# Patient Record
Sex: Male | Born: 1960 | Race: White | Hispanic: No | Marital: Married | Smoking: Former smoker
Health system: Southern US, Community
[De-identification: ages and names within clinical notes are randomized; demographics above are authoritative.]

## PROBLEM LIST (undated history)

## (undated) DIAGNOSIS — K7460 Unspecified cirrhosis of liver: Secondary | ICD-10-CM

## (undated) DIAGNOSIS — F329 Major depressive disorder, single episode, unspecified: Secondary | ICD-10-CM

## (undated) DIAGNOSIS — N189 Chronic kidney disease, unspecified: Secondary | ICD-10-CM

## (undated) DIAGNOSIS — B1920 Unspecified viral hepatitis C without hepatic coma: Secondary | ICD-10-CM

## (undated) DIAGNOSIS — K429 Umbilical hernia without obstruction or gangrene: Secondary | ICD-10-CM

## (undated) DIAGNOSIS — K649 Unspecified hemorrhoids: Secondary | ICD-10-CM

## (undated) DIAGNOSIS — M1990 Unspecified osteoarthritis, unspecified site: Secondary | ICD-10-CM

## (undated) DIAGNOSIS — M5030 Other cervical disc degeneration, unspecified cervical region: Secondary | ICD-10-CM

## (undated) DIAGNOSIS — R51 Headache: Secondary | ICD-10-CM

## (undated) DIAGNOSIS — G709 Myoneural disorder, unspecified: Secondary | ICD-10-CM

## (undated) DIAGNOSIS — F32A Depression, unspecified: Secondary | ICD-10-CM

---

## 1987-01-07 HISTORY — PX: FACIAL RECONSTRUCTION SURGERY: SHX631

## 1987-01-07 HISTORY — PX: EYE SURGERY: SHX253

## 1998-01-06 DIAGNOSIS — B1920 Unspecified viral hepatitis C without hepatic coma: Secondary | ICD-10-CM

## 1998-01-06 HISTORY — DX: Unspecified viral hepatitis C without hepatic coma: B19.20

## 1998-06-19 ENCOUNTER — Emergency Department (HOSPITAL_COMMUNITY): Admission: EM | Admit: 1998-06-19 | Discharge: 1998-06-19 | Payer: Self-pay | Admitting: Emergency Medicine

## 1998-06-20 ENCOUNTER — Encounter: Payer: Self-pay | Admitting: Emergency Medicine

## 1998-06-20 ENCOUNTER — Ambulatory Visit (HOSPITAL_COMMUNITY): Admission: RE | Admit: 1998-06-20 | Discharge: 1998-06-20 | Payer: Self-pay | Admitting: Emergency Medicine

## 1998-10-29 ENCOUNTER — Encounter: Payer: Self-pay | Admitting: Gastroenterology

## 1998-10-29 ENCOUNTER — Ambulatory Visit (HOSPITAL_COMMUNITY): Admission: RE | Admit: 1998-10-29 | Discharge: 1998-10-29 | Payer: Self-pay | Admitting: Gastroenterology

## 1998-11-01 ENCOUNTER — Encounter: Payer: Self-pay | Admitting: Gastroenterology

## 1998-11-01 ENCOUNTER — Ambulatory Visit (HOSPITAL_COMMUNITY): Admission: RE | Admit: 1998-11-01 | Discharge: 1998-11-01 | Payer: Self-pay | Admitting: Gastroenterology

## 1999-03-15 ENCOUNTER — Encounter: Payer: Self-pay | Admitting: Gastroenterology

## 1999-03-15 ENCOUNTER — Ambulatory Visit (HOSPITAL_COMMUNITY): Admission: RE | Admit: 1999-03-15 | Discharge: 1999-03-15 | Payer: Self-pay | Admitting: Gastroenterology

## 2001-01-06 HISTORY — PX: CERVICAL SPINE SURGERY: SHX589

## 2001-02-25 ENCOUNTER — Inpatient Hospital Stay (HOSPITAL_COMMUNITY): Admission: RE | Admit: 2001-02-25 | Discharge: 2001-02-27 | Payer: Self-pay | Admitting: Neurosurgery

## 2001-02-25 ENCOUNTER — Encounter: Payer: Self-pay | Admitting: Neurosurgery

## 2004-02-04 ENCOUNTER — Emergency Department: Payer: Self-pay | Admitting: Emergency Medicine

## 2004-02-05 ENCOUNTER — Ambulatory Visit: Payer: Self-pay | Admitting: Emergency Medicine

## 2004-02-07 ENCOUNTER — Ambulatory Visit: Payer: Self-pay

## 2004-02-12 ENCOUNTER — Encounter: Admission: RE | Admit: 2004-02-12 | Discharge: 2004-02-12 | Payer: Self-pay | Admitting: Gastroenterology

## 2007-08-16 ENCOUNTER — Emergency Department: Payer: Self-pay | Admitting: Emergency Medicine

## 2010-05-15 ENCOUNTER — Emergency Department: Payer: Self-pay | Admitting: Emergency Medicine

## 2010-05-31 ENCOUNTER — Other Ambulatory Visit: Payer: Self-pay | Admitting: Orthopaedic Surgery

## 2010-05-31 DIAGNOSIS — M542 Cervicalgia: Secondary | ICD-10-CM

## 2010-06-05 ENCOUNTER — Ambulatory Visit
Admission: RE | Admit: 2010-06-05 | Discharge: 2010-06-05 | Disposition: A | Discharge status: Home / Self Care | Payer: PRIVATE HEALTH INSURANCE | Source: Ambulatory Visit | Attending: Orthopaedic Surgery | Admitting: Orthopaedic Surgery

## 2010-06-05 DIAGNOSIS — M542 Cervicalgia: Secondary | ICD-10-CM

## 2010-06-06 ENCOUNTER — Other Ambulatory Visit: Payer: Self-pay | Admitting: Orthopaedic Surgery

## 2010-06-06 ENCOUNTER — Ambulatory Visit
Admission: RE | Admit: 2010-06-06 | Discharge: 2010-06-06 | Disposition: A | Discharge status: Home / Self Care | Payer: PRIVATE HEALTH INSURANCE | Source: Ambulatory Visit | Attending: Orthopaedic Surgery | Admitting: Orthopaedic Surgery

## 2010-06-06 DIAGNOSIS — S129XXA Fracture of neck, unspecified, initial encounter: Secondary | ICD-10-CM

## 2010-06-07 ENCOUNTER — Other Ambulatory Visit: Payer: PRIVATE HEALTH INSURANCE

## 2010-09-13 ENCOUNTER — Other Ambulatory Visit: Payer: Self-pay | Admitting: Neurosurgery

## 2010-09-13 DIAGNOSIS — M549 Dorsalgia, unspecified: Secondary | ICD-10-CM

## 2010-09-13 DIAGNOSIS — M542 Cervicalgia: Secondary | ICD-10-CM

## 2010-09-13 DIAGNOSIS — M5410 Radiculopathy, site unspecified: Secondary | ICD-10-CM

## 2010-09-16 MED ORDER — DIAZEPAM 2 MG PO TABS
10.0000 mg | ORAL_TABLET | Freq: Once | ORAL | Status: AC
  Administered 2010-09-17: 10 mg via ORAL

## 2010-09-17 ENCOUNTER — Ambulatory Visit
Admission: RE | Admit: 2010-09-17 | Discharge: 2010-09-17 | Disposition: A | Discharge status: Home / Self Care | Payer: Medicaid Other | Source: Ambulatory Visit | Attending: Neurosurgery | Admitting: Neurosurgery

## 2010-09-17 DIAGNOSIS — M5410 Radiculopathy, site unspecified: Secondary | ICD-10-CM

## 2010-09-17 DIAGNOSIS — M549 Dorsalgia, unspecified: Secondary | ICD-10-CM

## 2010-09-17 DIAGNOSIS — M542 Cervicalgia: Secondary | ICD-10-CM

## 2010-09-17 HISTORY — DX: Unspecified viral hepatitis C without hepatic coma: B19.20

## 2010-09-17 MED ORDER — IOHEXOL 300 MG/ML  SOLN
10.0000 mL | Freq: Once | INTRAMUSCULAR | Status: AC | PRN
  Administered 2010-09-17: 10 mL via INTRATHECAL

## 2011-07-08 ENCOUNTER — Other Ambulatory Visit: Payer: Self-pay | Admitting: Neurosurgery

## 2011-07-08 DIAGNOSIS — M542 Cervicalgia: Secondary | ICD-10-CM

## 2011-07-08 DIAGNOSIS — M5410 Radiculopathy, site unspecified: Secondary | ICD-10-CM

## 2011-07-08 DIAGNOSIS — M549 Dorsalgia, unspecified: Secondary | ICD-10-CM

## 2011-07-09 ENCOUNTER — Ambulatory Visit
Admission: RE | Admit: 2011-07-09 | Discharge: 2011-07-09 | Disposition: A | Discharge status: Home / Self Care | Payer: Medicaid Other | Source: Ambulatory Visit | Attending: Neurosurgery | Admitting: Neurosurgery

## 2011-07-09 VITALS — BP 86/59 | HR 63

## 2011-07-09 DIAGNOSIS — M5410 Radiculopathy, site unspecified: Secondary | ICD-10-CM

## 2011-07-09 DIAGNOSIS — M549 Dorsalgia, unspecified: Secondary | ICD-10-CM

## 2011-07-09 DIAGNOSIS — M542 Cervicalgia: Secondary | ICD-10-CM

## 2011-07-09 MED ORDER — MEPERIDINE HCL 100 MG/ML IJ SOLN
75.0000 mg | Freq: Once | INTRAMUSCULAR | Status: AC
  Administered 2011-07-09: 75 mg via INTRAMUSCULAR

## 2011-07-09 MED ORDER — DIAZEPAM 5 MG PO TABS
10.0000 mg | ORAL_TABLET | Freq: Once | ORAL | Status: AC
  Administered 2011-07-09: 10 mg via ORAL

## 2011-07-09 MED ORDER — IOHEXOL 300 MG/ML  SOLN
10.0000 mL | Freq: Once | INTRAMUSCULAR | Status: AC | PRN
  Administered 2011-07-09: 10 mL via INTRATHECAL

## 2011-07-09 MED ORDER — ONDANSETRON HCL 4 MG/2ML IJ SOLN
4.0000 mg | Freq: Once | INTRAMUSCULAR | Status: AC
  Administered 2011-07-09: 4 mg via INTRAMUSCULAR

## 2011-07-29 ENCOUNTER — Other Ambulatory Visit: Payer: Self-pay | Admitting: Neurosurgery

## 2011-07-31 ENCOUNTER — Encounter (HOSPITAL_COMMUNITY): Payer: Self-pay | Admitting: Certified Registered Nurse Anesthetist

## 2011-07-31 ENCOUNTER — Inpatient Hospital Stay (HOSPITAL_COMMUNITY): Payer: Medicaid Other | Admitting: Certified Registered Nurse Anesthetist

## 2011-07-31 ENCOUNTER — Encounter (HOSPITAL_COMMUNITY): Payer: Self-pay | Admitting: *Deleted

## 2011-07-31 MED ORDER — DEXTROSE 5 % IV SOLN: 2.0000 g | INTRAVENOUS | Status: DC

## 2011-07-31 NOTE — Progress Notes (Signed)
PCP is Dr Glenis Smoker at [facility].

## 2011-08-01 ENCOUNTER — Encounter (HOSPITAL_COMMUNITY): Payer: Self-pay | Admitting: Certified Registered Nurse Anesthetist

## 2011-08-01 ENCOUNTER — Other Ambulatory Visit: Payer: Self-pay

## 2011-08-01 ENCOUNTER — Encounter (HOSPITAL_COMMUNITY)
Admission: RE | Disposition: A | Discharge status: Home / Self Care | Payer: Self-pay | Source: Ambulatory Visit | Attending: Neurosurgery

## 2011-08-01 ENCOUNTER — Ambulatory Visit (HOSPITAL_COMMUNITY)
Admission: RE | Admit: 2011-08-01 | Discharge: 2011-08-01 | Disposition: A | Discharge status: Home / Self Care | Payer: Medicaid Other | Source: Ambulatory Visit | Attending: Neurosurgery | Admitting: Neurosurgery

## 2011-08-01 ENCOUNTER — Encounter (HOSPITAL_COMMUNITY): Payer: Self-pay | Admitting: *Deleted

## 2011-08-01 DIAGNOSIS — Z01812 Encounter for preprocedural laboratory examination: Secondary | ICD-10-CM | POA: Insufficient documentation

## 2011-08-01 DIAGNOSIS — Z01818 Encounter for other preprocedural examination: Secondary | ICD-10-CM | POA: Insufficient documentation

## 2011-08-01 DIAGNOSIS — M4802 Spinal stenosis, cervical region: Secondary | ICD-10-CM | POA: Insufficient documentation

## 2011-08-01 DIAGNOSIS — E119 Type 2 diabetes mellitus without complications: Secondary | ICD-10-CM | POA: Insufficient documentation

## 2011-08-01 HISTORY — DX: Headache: R51

## 2011-08-01 HISTORY — DX: Unspecified hemorrhoids: K64.9

## 2011-08-01 HISTORY — DX: Depression, unspecified: F32.A

## 2011-08-01 HISTORY — DX: Major depressive disorder, single episode, unspecified: F32.9

## 2011-08-01 HISTORY — DX: Myoneural disorder, unspecified: G70.9

## 2011-08-01 HISTORY — DX: Unspecified osteoarthritis, unspecified site: M19.90

## 2011-08-01 HISTORY — DX: Chronic kidney disease, unspecified: N18.9

## 2011-08-01 LAB — COMPREHENSIVE METABOLIC PANEL
ALT: 45 U/L (ref 0–53)
AST: 42 U/L — ABNORMAL HIGH (ref 0–37)
Alkaline Phosphatase: 104 U/L (ref 39–117)
CO2: 26 mEq/L (ref 19–32)
Chloride: 106 mEq/L (ref 96–112)
GFR calc non Af Amer: >90 mL/min (ref >90)
Potassium: 3.6 mEq/L (ref 3.5–5.1)
Sodium: 140 mEq/L (ref 135–145)
Total Bilirubin: 0.7 mg/dL (ref 0.3–1.2)

## 2011-08-01 LAB — CBC
MCH: 35.9 pg — ABNORMAL HIGH (ref 26.0–34.0)
MCHC: 34.5 g/dL (ref 30.0–36.0)
Platelets: 61 10*3/uL — ABNORMAL LOW (ref 150–400)

## 2011-08-01 LAB — SURGICAL PCR SCREEN: MRSA, PCR: NEGATIVE

## 2011-08-01 SURGERY — ANTERIOR CERVICAL DECOMPRESSION/DISCECTOMY FUSION 3 LEVELS
Anesthesia: General

## 2011-08-01 MED ORDER — CEFAZOLIN SODIUM-DEXTROSE 2-3 GM-% IV SOLR
INTRAVENOUS | Status: AC
  Filled 2011-08-01: qty 50

## 2011-08-01 MED ORDER — MUPIROCIN 2 % EX OINT
TOPICAL_OINTMENT | CUTANEOUS | Status: AC
  Administered 2011-08-01: 1
  Filled 2011-08-01: qty 22

## 2011-08-01 SURGICAL SUPPLY — 62 items
APL SKNCLS STERI-STRIP NONHPOA (GAUZE/BANDAGES/DRESSINGS)
BANDAGE GAUZE ELAST BULKY 4 IN (GAUZE/BANDAGES/DRESSINGS) ×2 IMPLANT
BENZOIN TINCTURE PRP APPL 2/3 (GAUZE/BANDAGES/DRESSINGS) ×1 IMPLANT
BLADE ULTRA TIP 2M (BLADE) ×1 IMPLANT
BUR BARREL STRAIGHT FLUTE 4.0 (BURR) IMPLANT
BUR MATCHSTICK NEURO 3.0 LAGG (BURR) ×1 IMPLANT
CANISTER SUCTION 2500CC (MISCELLANEOUS) ×1 IMPLANT
CLOTH BEACON ORANGE TIMEOUT ST (SAFETY) ×1 IMPLANT
CONT SPEC 4OZ CLIKSEAL STRL BL (MISCELLANEOUS) ×1 IMPLANT
COVER MAYO STAND STRL (DRAPES) ×1 IMPLANT
DRAPE LAPAROTOMY 100X72 PEDS (DRAPES) ×1 IMPLANT
DRAPE MICROSCOPE LEICA (MISCELLANEOUS) ×1 IMPLANT
DRAPE POUCH INSTRU U-SHP 10X18 (DRAPES) ×1 IMPLANT
DRAPE PROXIMA HALF (DRAPES) IMPLANT
DURAPREP 6ML APPLICATOR 50/CS (WOUND CARE) ×1 IMPLANT
ELECT REM PT RETURN 9FT ADLT (ELECTROSURGICAL)
ELECTRODE REM PT RTRN 9FT ADLT (ELECTROSURGICAL) ×1 IMPLANT
GAUZE SPONGE 4X4 16PLY XRAY LF (GAUZE/BANDAGES/DRESSINGS) IMPLANT
GLOVE BIO SURGEON STRL SZ 6.5 (GLOVE) IMPLANT
GLOVE BIO SURGEON STRL SZ7 (GLOVE) IMPLANT
GLOVE BIO SURGEON STRL SZ7.5 (GLOVE) IMPLANT
GLOVE BIO SURGEON STRL SZ8 (GLOVE) IMPLANT
GLOVE BIO SURGEON STRL SZ8.5 (GLOVE) IMPLANT
GLOVE BIOGEL M 8.0 STRL (GLOVE) ×1 IMPLANT
GLOVE BIOGEL PI IND STRL 7.5 (GLOVE) IMPLANT
GLOVE BIOGEL PI INDICATOR 7.5 (GLOVE)
GLOVE ECLIPSE 6.5 STRL STRAW (GLOVE) IMPLANT
GLOVE ECLIPSE 7.0 STRL STRAW (GLOVE) IMPLANT
GLOVE ECLIPSE 7.5 STRL STRAW (GLOVE) IMPLANT
GLOVE ECLIPSE 8.0 STRL XLNG CF (GLOVE) IMPLANT
GLOVE ECLIPSE 8.5 STRL (GLOVE) IMPLANT
GLOVE EXAM NITRILE LRG STRL (GLOVE) IMPLANT
GLOVE EXAM NITRILE MD LF STRL (GLOVE) IMPLANT
GLOVE EXAM NITRILE XL STR (GLOVE) IMPLANT
GLOVE EXAM NITRILE XS STR PU (GLOVE) IMPLANT
GLOVE INDICATOR 6.5 STRL GRN (GLOVE) IMPLANT
GLOVE INDICATOR 7.0 STRL GRN (GLOVE) IMPLANT
GLOVE INDICATOR 7.5 STRL GRN (GLOVE) IMPLANT
GLOVE INDICATOR 8.0 STRL GRN (GLOVE) IMPLANT
GLOVE INDICATOR 8.5 STRL (GLOVE) IMPLANT
GLOVE OPTIFIT SS 8.0 STRL (GLOVE) IMPLANT
GLOVE SURG SS PI 6.5 STRL IVOR (GLOVE) IMPLANT
GOWN BRE IMP SLV AUR LG STRL (GOWN DISPOSABLE) ×1 IMPLANT
GOWN BRE IMP SLV AUR XL STRL (GOWN DISPOSABLE) IMPLANT
GOWN STRL REIN 2XL LVL4 (GOWN DISPOSABLE) IMPLANT
HEAD HALTER (SOFTGOODS) ×1 IMPLANT
HEMOSTAT POWDER KIT SURGIFOAM (HEMOSTASIS) IMPLANT
KIT BASIN OR (CUSTOM PROCEDURE TRAY) ×1 IMPLANT
KIT ROOM TURNOVER OR (KITS) ×1 IMPLANT
NS IRRIG 1000ML POUR BTL (IV SOLUTION) ×1 IMPLANT
PACK LAMINECTOMY NEURO (CUSTOM PROCEDURE TRAY) ×1 IMPLANT
PATTIES SURGICAL .5 X1 (DISPOSABLE) ×1 IMPLANT
RUBBERBAND STERILE (MISCELLANEOUS) ×2 IMPLANT
SPONGE GAUZE 4X4 12PLY (GAUZE/BANDAGES/DRESSINGS) ×1 IMPLANT
SPONGE INTESTINAL PEANUT (DISPOSABLE) ×2 IMPLANT
SPONGE SURGIFOAM ABS GEL 100 (HEMOSTASIS) ×1 IMPLANT
STRIP CLOSURE SKIN 1/2X4 (GAUZE/BANDAGES/DRESSINGS) ×1 IMPLANT
SUT VIC AB 3-0 SH 8-18 (SUTURE) ×1 IMPLANT
SYR 20ML ECCENTRIC (SYRINGE) ×1 IMPLANT
TOWEL OR 17X24 6PK STRL BLUE (TOWEL DISPOSABLE) ×1 IMPLANT
TOWEL OR 17X26 10 PK STRL BLUE (TOWEL DISPOSABLE) ×1 IMPLANT
WATER STERILE IRR 1000ML POUR (IV SOLUTION) ×1 IMPLANT

## 2011-08-01 NOTE — Anesthesia Preprocedure Evaluation (Signed)
Anesthesia Evaluation  Patient identified by MRN, date of birth, ID band Patient awake    Reviewed: Allergy & Precautions, H&P , NPO status , Patient's Chart, lab work & pertinent test results  History of Anesthesia Complications (+) PONV  Airway Mallampati: II TM Distance: <3 FB Neck ROM: Limited  Mouth opening: Limited Mouth Opening  Dental  (+) Chipped   Pulmonary former smoker,    Pulmonary exam normal       Cardiovascular     Neuro/Psych  Headaches, PSYCHIATRIC DISORDERS Depression    GI/Hepatic (+) Hepatitis -, C  Endo/Other  Type 2  Renal/GU      Musculoskeletal   Abdominal Normal abdominal exam  (+)   Peds  Hematology   Anesthesia Other Findings   Reproductive/Obstetrics                           Anesthesia Physical Anesthesia Plan  ASA: III  Anesthesia Plan: General   Post-op Pain Management:    Induction: Intravenous  Airway Management Planned: Oral ETT  Additional Equipment:   Intra-op Plan:   Post-operative Plan: Extubation in OR  Informed Consent: I have reviewed the patients History and Physical, chart, labs and discussed the procedure including the risks, benefits and alternatives for the proposed anesthesia with the patient or authorized representative who has indicated his/her understanding and acceptance.   Dental advisory given  Plan Discussed with: CRNA, Anesthesiologist and Surgeon  Anesthesia Plan Comments:         Anesthesia Quick Evaluation

## 2011-08-01 NOTE — Transfer of Care (Signed)
Case cancelled

## 2011-08-01 NOTE — Anesthesia Postprocedure Evaluation (Signed)
Case cancelled

## 2011-08-01 NOTE — Preoperative (Signed)
Beta Blockers   Reason not to administer Beta Blockers:Not Applicable 

## 2011-08-01 NOTE — Progress Notes (Signed)
Patient ID: Benjamin Bradley, male   DOB: 1960-12-23, 51 y.o.   MRN: [id number] patien to have cervical fusion today. His platelets count was 60k   mutual   with anesthesia the surgery was cancelled. He is to be seen by his MD in [facility]

## 2011-08-01 NOTE — Progress Notes (Signed)
NOTIFIED DR Jean Rosenthal OF EKG RESULTS, SHE WILL EVALUATE PATIENT IN OR.

## 2014-01-19 ENCOUNTER — Encounter (HOSPITAL_COMMUNITY): Payer: Self-pay | Admitting: Neurosurgery

## 2015-08-11 ENCOUNTER — Encounter (HOSPITAL_COMMUNITY): Payer: Self-pay | Admitting: Emergency Medicine

## 2015-08-11 ENCOUNTER — Emergency Department (HOSPITAL_COMMUNITY)
Admission: EM | Admit: 2015-08-11 | Discharge: 2015-08-11 | Disposition: A | Discharge status: Home / Self Care | Payer: Medicare Other | Attending: Emergency Medicine | Admitting: Emergency Medicine

## 2015-08-11 DIAGNOSIS — K739 Chronic hepatitis, unspecified: Secondary | ICD-10-CM

## 2015-08-11 DIAGNOSIS — E1122 Type 2 diabetes mellitus with diabetic chronic kidney disease: Secondary | ICD-10-CM | POA: Diagnosis not present

## 2015-08-11 DIAGNOSIS — Z87891 Personal history of nicotine dependence: Secondary | ICD-10-CM | POA: Insufficient documentation

## 2015-08-11 DIAGNOSIS — N189 Chronic kidney disease, unspecified: Secondary | ICD-10-CM | POA: Diagnosis not present

## 2015-08-11 DIAGNOSIS — K439 Ventral hernia without obstruction or gangrene: Secondary | ICD-10-CM | POA: Diagnosis not present

## 2015-08-11 DIAGNOSIS — R1033 Periumbilical pain: Secondary | ICD-10-CM | POA: Diagnosis present

## 2015-08-11 LAB — URINALYSIS, ROUTINE W REFLEX MICROSCOPIC
Glucose, UA: NEGATIVE mg/dL
Ketones, ur: NEGATIVE mg/dL
NITRITE: NEGATIVE
Protein, ur: 30 mg/dL — AB
SPECIFIC GRAVITY, URINE: 1.019 (ref 1.005–1.030)
pH: 5 (ref 5.0–8.0)

## 2015-08-11 LAB — URINE MICROSCOPIC-ADD ON: SQUAMOUS EPITHELIAL / LPF: NONE SEEN

## 2015-08-11 LAB — COMPREHENSIVE METABOLIC PANEL
ALK PHOS: 78 U/L (ref 38–126)
ALT: 31 U/L (ref 17–63)
ANION GAP: 6 (ref 5–15)
AST: 42 U/L — ABNORMAL HIGH (ref 15–41)
Albumin: 3.2 g/dL — ABNORMAL LOW (ref 3.5–5.0)
BUN: 17 mg/dL (ref 6–20)
CALCIUM: 8.6 mg/dL — AB (ref 8.9–10.3)
CO2: 26 mmol/L (ref 22–32)
Chloride: 105 mmol/L (ref 101–111)
Creatinine, Ser: 0.9 mg/dL (ref 0.61–1.24)
GFR calc non Af Amer: >60 mL/min (ref >60)
Glucose, Bld: 220 mg/dL — ABNORMAL HIGH (ref 65–99)
POTASSIUM: 4.5 mmol/L (ref 3.5–5.1)
SODIUM: 137 mmol/L (ref 135–145)
TOTAL PROTEIN: 6.5 g/dL (ref 6.5–8.1)
Total Bilirubin: 1.6 mg/dL — ABNORMAL HIGH (ref 0.3–1.2)

## 2015-08-11 LAB — CBC
HCT: 43.5 % (ref 39.0–52.0)
HEMOGLOBIN: 15 g/dL (ref 13.0–17.0)
MCH: 34.8 pg — AB (ref 26.0–34.0)
MCHC: 34.5 g/dL (ref 30.0–36.0)
MCV: 100.9 fL — ABNORMAL HIGH (ref 78.0–100.0)
Platelets: 162 10*3/uL (ref 150–400)
RBC: 4.31 MIL/uL (ref 4.22–5.81)
RDW: 13.4 % (ref 11.5–15.5)
WBC: 7.1 10*3/uL (ref 4.0–10.5)

## 2015-08-11 LAB — AMMONIA

## 2015-08-11 LAB — LIPASE, BLOOD: Lipase: 40 U/L (ref 11–51)

## 2015-08-11 NOTE — ED Notes (Signed)
Pt stated unable to give urine sample at this time, urinal and call light at bedside. 

## 2015-08-11 NOTE — ED Triage Notes (Signed)
Pt complaint of mid abdominal pain and nausea related to umbilical hernia  AND ascites diagnosed yesterday at [facility]. Pt sent here for further evaluation to check liver enzymes and CT.
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
## 2015-10-13 ENCOUNTER — Emergency Department
Admission: EM | Admit: 2015-10-13 | Discharge: 2015-10-14 | Disposition: A | Discharge status: Home / Self Care | Payer: Medicare Other | Attending: Emergency Medicine | Admitting: Emergency Medicine

## 2015-10-13 ENCOUNTER — Encounter: Payer: Self-pay | Admitting: Emergency Medicine

## 2015-10-13 DIAGNOSIS — Z87891 Personal history of nicotine dependence: Secondary | ICD-10-CM | POA: Insufficient documentation

## 2015-10-13 DIAGNOSIS — E1122 Type 2 diabetes mellitus with diabetic chronic kidney disease: Secondary | ICD-10-CM | POA: Diagnosis not present

## 2015-10-13 DIAGNOSIS — R1031 Right lower quadrant pain: Secondary | ICD-10-CM | POA: Diagnosis present

## 2015-10-13 DIAGNOSIS — Z79899 Other long term (current) drug therapy: Secondary | ICD-10-CM | POA: Diagnosis not present

## 2015-10-13 DIAGNOSIS — R0600 Dyspnea, unspecified: Secondary | ICD-10-CM | POA: Diagnosis not present

## 2015-10-13 DIAGNOSIS — R601 Generalized edema: Secondary | ICD-10-CM | POA: Diagnosis not present

## 2015-10-13 DIAGNOSIS — R1010 Upper abdominal pain, unspecified: Secondary | ICD-10-CM | POA: Insufficient documentation

## 2015-10-13 DIAGNOSIS — N189 Chronic kidney disease, unspecified: Secondary | ICD-10-CM | POA: Diagnosis not present

## 2015-10-13 HISTORY — DX: Other cervical disc degeneration, unspecified cervical region: M50.30

## 2015-10-13 HISTORY — DX: Umbilical hernia without obstruction or gangrene: K42.9

## 2015-10-13 LAB — COMPREHENSIVE METABOLIC PANEL
ALT: 32 U/L (ref 17–63)
AST: 51 U/L — AB (ref 15–41)
Albumin: 3.1 g/dL — ABNORMAL LOW (ref 3.5–5.0)
Alkaline Phosphatase: 80 U/L (ref 38–126)
Anion gap: 6 (ref 5–15)
BUN: 15 mg/dL (ref 6–20)
CHLORIDE: 103 mmol/L (ref 101–111)
CO2: 27 mmol/L (ref 22–32)
CREATININE: 0.99 mg/dL (ref 0.61–1.24)
Calcium: 8.6 mg/dL — ABNORMAL LOW (ref 8.9–10.3)
GFR calc Af Amer: >60 mL/min (ref >60)
Glucose, Bld: 145 mg/dL — ABNORMAL HIGH (ref 65–99)
Potassium: 3.9 mmol/L (ref 3.5–5.1)
SODIUM: 136 mmol/L (ref 135–145)
Total Bilirubin: 2.3 mg/dL — ABNORMAL HIGH (ref 0.3–1.2)
Total Protein: 6.7 g/dL (ref 6.5–8.1)

## 2015-10-13 LAB — CBC
HCT: 44.6 % (ref 40.0–52.0)
Hemoglobin: 15.4 g/dL (ref 13.0–18.0)
MCH: 35.6 pg — AB (ref 26.0–34.0)
MCHC: 34.6 g/dL (ref 32.0–36.0)
MCV: 103.1 fL — AB (ref 80.0–100.0)
PLATELETS: 116 10*3/uL — AB (ref 150–440)
RBC: 4.33 MIL/uL — ABNORMAL LOW (ref 4.40–5.90)
RDW: 13.7 % (ref 11.5–14.5)
WBC: 5.3 10*3/uL (ref 3.8–10.6)

## 2015-10-13 LAB — LIPASE, BLOOD: LIPASE: 30 U/L (ref 11–51)

## 2015-10-13 NOTE — ED Triage Notes (Signed)
Patient with complaint of right lower abd pain, nausea and vomiting that started around 8:00 pm tonight.

## 2015-10-14 DIAGNOSIS — R601 Generalized edema: Secondary | ICD-10-CM | POA: Diagnosis not present

## 2015-10-14 MED ORDER — POTASSIUM CHLORIDE 20 MEQ PO PACK: 20.0000 meq | PACK | Freq: Every day | ORAL | 0 refills | Status: AC

## 2015-10-14 MED ORDER — FUROSEMIDE 20 MG PO TABS: 20.0000 mg | ORAL_TABLET | Freq: Every day | ORAL | 0 refills | Status: AC

## 2015-10-14 MED ORDER — FUROSEMIDE 40 MG PO TABS
20.0000 mg | ORAL_TABLET | Freq: Once | ORAL | Status: AC
  Administered 2015-10-14: 20 mg via ORAL
  Filled 2015-10-14: qty 1
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
## 2015-10-14 NOTE — ED Notes (Signed)
Dr. Pershing ProudSchaevitz in to see and assess patient at this time.

## 2016-06-02 ENCOUNTER — Other Ambulatory Visit
Admission: RE | Admit: 2016-06-02 | Discharge: 2016-06-02 | Disposition: A | Discharge status: Home / Self Care | Payer: Medicare Other | Source: Ambulatory Visit | Attending: Internal Medicine | Admitting: Internal Medicine

## 2016-06-02 DIAGNOSIS — Z029 Encounter for administrative examinations, unspecified: Secondary | ICD-10-CM | POA: Insufficient documentation

## 2016-06-02 LAB — CBC WITH DIFFERENTIAL/PLATELET
BASOS ABS: 0.1 10*3/uL (ref 0–0.1)
Basophils Relative: 1 %
EOS ABS: 0.1 10*3/uL (ref 0–0.7)
Eosinophils Relative: 1 %
HCT: 26.3 % — ABNORMAL LOW (ref 40.0–52.0)
Hemoglobin: 9.2 g/dL — ABNORMAL LOW (ref 13.0–18.0)
LYMPHS PCT: 19 %
Lymphs Abs: 1.7 10*3/uL (ref 1.0–3.6)
MCH: 35.9 pg — AB (ref 26.0–34.0)
MCHC: 34.9 g/dL (ref 32.0–36.0)
MCV: 103 fL — ABNORMAL HIGH (ref 80.0–100.0)
Monocytes Absolute: 0.9 10*3/uL (ref 0.2–1.0)
Monocytes Relative: 10 %
NEUTROS PCT: 69 %
Neutro Abs: 6 10*3/uL (ref 1.4–6.5)
Platelets: 201 10*3/uL (ref 150–440)
RBC: 2.55 MIL/uL — AB (ref 4.40–5.90)
RDW: 16.6 % — ABNORMAL HIGH (ref 11.5–14.5)
WBC: 8.8 10*3/uL (ref 3.8–10.6)

## 2016-06-02 LAB — BASIC METABOLIC PANEL
ANION GAP: 8 (ref 5–15)
BUN: 43 mg/dL — AB (ref 6–20)
CHLORIDE: 103 mmol/L (ref 101–111)
CO2: 20 mmol/L — ABNORMAL LOW (ref 22–32)
Calcium: 8.7 mg/dL — ABNORMAL LOW (ref 8.9–10.3)
Creatinine, Ser: 1.9 mg/dL — ABNORMAL HIGH (ref 0.61–1.24)
GFR calc Af Amer: 44 mL/min — ABNORMAL LOW (ref >60)
GFR calc non Af Amer: 38 mL/min — ABNORMAL LOW (ref >60)
Glucose, Bld: 96 mg/dL (ref 65–99)
POTASSIUM: 4.4 mmol/L (ref 3.5–5.1)
SODIUM: 131 mmol/L — AB (ref 135–145)

## 2016-06-03 ENCOUNTER — Encounter: Payer: Self-pay | Admitting: Emergency Medicine

## 2016-06-03 ENCOUNTER — Inpatient Hospital Stay
Admission: EM | Admit: 2016-06-03 | Discharge: 2016-07-06 | DRG: 432 | Disposition: E | Payer: Medicare Other | Attending: Specialist | Admitting: Specialist

## 2016-06-03 ENCOUNTER — Inpatient Hospital Stay: Payer: Medicare Other

## 2016-06-03 DIAGNOSIS — K7469 Other cirrhosis of liver: Secondary | ICD-10-CM | POA: Diagnosis not present

## 2016-06-03 DIAGNOSIS — K7040 Alcoholic hepatic failure without coma: Secondary | ICD-10-CM | POA: Diagnosis present

## 2016-06-03 DIAGNOSIS — E1140 Type 2 diabetes mellitus with diabetic neuropathy, unspecified: Secondary | ICD-10-CM | POA: Diagnosis present

## 2016-06-03 DIAGNOSIS — Z9889 Other specified postprocedural states: Secondary | ICD-10-CM

## 2016-06-03 DIAGNOSIS — R042 Hemoptysis: Secondary | ICD-10-CM | POA: Diagnosis present

## 2016-06-03 DIAGNOSIS — N179 Acute kidney failure, unspecified: Secondary | ICD-10-CM | POA: Diagnosis not present

## 2016-06-03 DIAGNOSIS — K7031 Alcoholic cirrhosis of liver with ascites: Secondary | ICD-10-CM | POA: Diagnosis present

## 2016-06-03 DIAGNOSIS — J90 Pleural effusion, not elsewhere classified: Secondary | ICD-10-CM | POA: Diagnosis not present

## 2016-06-03 DIAGNOSIS — E875 Hyperkalemia: Secondary | ICD-10-CM | POA: Diagnosis not present

## 2016-06-03 DIAGNOSIS — K7460 Unspecified cirrhosis of liver: Secondary | ICD-10-CM

## 2016-06-03 DIAGNOSIS — K769 Liver disease, unspecified: Secondary | ICD-10-CM

## 2016-06-03 DIAGNOSIS — T7840XD Allergy, unspecified, subsequent encounter: Secondary | ICD-10-CM

## 2016-06-03 DIAGNOSIS — Z681 Body mass index (BMI) 19 or less, adult: Secondary | ICD-10-CM | POA: Diagnosis not present

## 2016-06-03 DIAGNOSIS — D62 Acute posthemorrhagic anemia: Secondary | ICD-10-CM | POA: Diagnosis present

## 2016-06-03 DIAGNOSIS — K767 Hepatorenal syndrome: Secondary | ICD-10-CM | POA: Diagnosis present

## 2016-06-03 DIAGNOSIS — R059 Cough, unspecified: Secondary | ICD-10-CM

## 2016-06-03 DIAGNOSIS — K922 Gastrointestinal hemorrhage, unspecified: Secondary | ICD-10-CM | POA: Diagnosis present

## 2016-06-03 DIAGNOSIS — J9601 Acute respiratory failure with hypoxia: Secondary | ICD-10-CM | POA: Diagnosis not present

## 2016-06-03 DIAGNOSIS — I959 Hypotension, unspecified: Secondary | ICD-10-CM | POA: Diagnosis present

## 2016-06-03 DIAGNOSIS — N170 Acute kidney failure with tubular necrosis: Secondary | ICD-10-CM | POA: Diagnosis not present

## 2016-06-03 DIAGNOSIS — R188 Other ascites: Secondary | ICD-10-CM

## 2016-06-03 DIAGNOSIS — K7210 Chronic hepatic failure without coma: Secondary | ICD-10-CM | POA: Diagnosis present

## 2016-06-03 DIAGNOSIS — Z978 Presence of other specified devices: Secondary | ICD-10-CM

## 2016-06-03 DIAGNOSIS — R05 Cough: Secondary | ICD-10-CM

## 2016-06-03 DIAGNOSIS — Z886 Allergy status to analgesic agent status: Secondary | ICD-10-CM

## 2016-06-03 DIAGNOSIS — Z598 Other problems related to housing and economic circumstances: Secondary | ICD-10-CM

## 2016-06-03 DIAGNOSIS — A419 Sepsis, unspecified organism: Secondary | ICD-10-CM | POA: Diagnosis not present

## 2016-06-03 DIAGNOSIS — N12 Tubulo-interstitial nephritis, not specified as acute or chronic: Secondary | ICD-10-CM | POA: Diagnosis present

## 2016-06-03 DIAGNOSIS — Z515 Encounter for palliative care: Secondary | ICD-10-CM

## 2016-06-03 DIAGNOSIS — Z7189 Other specified counseling: Secondary | ICD-10-CM | POA: Diagnosis not present

## 2016-06-03 DIAGNOSIS — I8511 Secondary esophageal varices with bleeding: Secondary | ICD-10-CM | POA: Diagnosis present

## 2016-06-03 DIAGNOSIS — I8501 Esophageal varices with bleeding: Secondary | ICD-10-CM | POA: Diagnosis not present

## 2016-06-03 DIAGNOSIS — D689 Coagulation defect, unspecified: Secondary | ICD-10-CM | POA: Diagnosis not present

## 2016-06-03 DIAGNOSIS — R001 Bradycardia, unspecified: Secondary | ICD-10-CM | POA: Diagnosis present

## 2016-06-03 DIAGNOSIS — F1020 Alcohol dependence, uncomplicated: Secondary | ICD-10-CM | POA: Diagnosis present

## 2016-06-03 DIAGNOSIS — N19 Unspecified kidney failure: Secondary | ICD-10-CM

## 2016-06-03 DIAGNOSIS — Z885 Allergy status to narcotic agent status: Secondary | ICD-10-CM

## 2016-06-03 DIAGNOSIS — R0600 Dyspnea, unspecified: Secondary | ICD-10-CM

## 2016-06-03 DIAGNOSIS — Z4659 Encounter for fitting and adjustment of other gastrointestinal appliance and device: Secondary | ICD-10-CM

## 2016-06-03 DIAGNOSIS — Z452 Encounter for adjustment and management of vascular access device: Secondary | ICD-10-CM

## 2016-06-03 DIAGNOSIS — N183 Chronic kidney disease, stage 3 (moderate): Secondary | ICD-10-CM | POA: Diagnosis present

## 2016-06-03 DIAGNOSIS — E871 Hypo-osmolality and hyponatremia: Secondary | ICD-10-CM | POA: Diagnosis present

## 2016-06-03 DIAGNOSIS — R64 Cachexia: Secondary | ICD-10-CM | POA: Diagnosis present

## 2016-06-03 DIAGNOSIS — J918 Pleural effusion in other conditions classified elsewhere: Secondary | ICD-10-CM | POA: Diagnosis not present

## 2016-06-03 DIAGNOSIS — Z8619 Personal history of other infectious and parasitic diseases: Secondary | ICD-10-CM

## 2016-06-03 DIAGNOSIS — R0602 Shortness of breath: Secondary | ICD-10-CM

## 2016-06-03 DIAGNOSIS — D684 Acquired coagulation factor deficiency: Secondary | ICD-10-CM | POA: Diagnosis present

## 2016-06-03 DIAGNOSIS — J9600 Acute respiratory failure, unspecified whether with hypoxia or hypercapnia: Secondary | ICD-10-CM

## 2016-06-03 DIAGNOSIS — R109 Unspecified abdominal pain: Secondary | ICD-10-CM

## 2016-06-03 DIAGNOSIS — Z66 Do not resuscitate: Secondary | ICD-10-CM | POA: Diagnosis not present

## 2016-06-03 DIAGNOSIS — Z87891 Personal history of nicotine dependence: Secondary | ICD-10-CM

## 2016-06-03 DIAGNOSIS — B182 Chronic viral hepatitis C: Secondary | ICD-10-CM | POA: Diagnosis present

## 2016-06-03 HISTORY — DX: Unspecified cirrhosis of liver: K74.60

## 2016-06-03 LAB — LIPASE, BLOOD: LIPASE: 34 U/L (ref 11–51)

## 2016-06-03 LAB — CBC WITH DIFFERENTIAL/PLATELET
Basophils Absolute: 0 10*3/uL (ref 0–0.1)
Basophils Relative: 1 %
EOS PCT: 1 %
Eosinophils Absolute: 0.1 10*3/uL (ref 0–0.7)
HCT: 18.8 % — ABNORMAL LOW (ref 40.0–52.0)
Hemoglobin: 6.5 g/dL — ABNORMAL LOW (ref 13.0–18.0)
LYMPHS PCT: 21 %
Lymphs Abs: 1.4 10*3/uL (ref 1.0–3.6)
MCH: 36.2 pg — AB (ref 26.0–34.0)
MCHC: 34.5 g/dL (ref 32.0–36.0)
MCV: 105 fL — AB (ref 80.0–100.0)
MONOS PCT: 11 %
Monocytes Absolute: 0.8 10*3/uL (ref 0.2–1.0)
Neutro Abs: 4.5 10*3/uL (ref 1.4–6.5)
Neutrophils Relative %: 66 %
PLATELETS: 163 10*3/uL (ref 150–440)
RBC: 1.79 MIL/uL — ABNORMAL LOW (ref 4.40–5.90)
RDW: 16.2 % — ABNORMAL HIGH (ref 11.5–14.5)
WBC: 6.8 10*3/uL (ref 3.8–10.6)

## 2016-06-03 LAB — COMPREHENSIVE METABOLIC PANEL
ALT: 14 U/L — AB (ref 17–63)
AST: 30 U/L (ref 15–41)
Albumin: 3.2 g/dL — ABNORMAL LOW (ref 3.5–5.0)
Alkaline Phosphatase: 49 U/L (ref 38–126)
Anion gap: 5 (ref 5–15)
BUN: 52 mg/dL — AB (ref 6–20)
CHLORIDE: 109 mmol/L (ref 101–111)
CO2: 20 mmol/L — ABNORMAL LOW (ref 22–32)
CREATININE: 1.96 mg/dL — AB (ref 0.61–1.24)
Calcium: 8.3 mg/dL — ABNORMAL LOW (ref 8.9–10.3)
GFR calc Af Amer: 42 mL/min — ABNORMAL LOW (ref >60)
GFR, EST NON AFRICAN AMERICAN: 36 mL/min — AB (ref >60)
GLUCOSE: 108 mg/dL — AB (ref 65–99)
Potassium: 4.8 mmol/L (ref 3.5–5.1)
Sodium: 134 mmol/L — ABNORMAL LOW (ref 135–145)
Total Bilirubin: 1.5 mg/dL — ABNORMAL HIGH (ref 0.3–1.2)
Total Protein: 5.5 g/dL — ABNORMAL LOW (ref 6.5–8.1)

## 2016-06-03 LAB — PREPARE RBC (CROSSMATCH)

## 2016-06-03 LAB — ABO/RH: ABO/RH(D): O POS

## 2016-06-03 LAB — PROTIME-INR
INR: 4.02 — AB
PROTHROMBIN TIME: 40.2 s — AB (ref 11.4–15.2)

## 2016-06-03 LAB — GLUCOSE, CAPILLARY: Glucose-Capillary: 73 mg/dL (ref 65–99)

## 2016-06-03 LAB — AMMONIA: Ammonia: <9 umol/L — ABNORMAL LOW (ref 9–35)

## 2016-06-03 MED ORDER — SODIUM CHLORIDE 0.9 % IV SOLN: 10.0000 mL/h | Freq: Once | INTRAVENOUS | Status: DC

## 2016-06-03 MED ORDER — OCTREOTIDE LOAD VIA INFUSION
50.0000 ug | Freq: Once | INTRAVENOUS | Status: AC
  Administered 2016-06-03: 50 ug via INTRAVENOUS
  Filled 2016-06-03: qty 25

## 2016-06-03 MED ORDER — SODIUM CHLORIDE 0.9 % IV BOLUS (SEPSIS)
1000.0000 mL | Freq: Once | INTRAVENOUS | Status: AC
  Administered 2016-06-03: 1000 mL via INTRAVENOUS

## 2016-06-03 MED ORDER — ONDANSETRON HCL 4 MG/2ML IJ SOLN
INTRAMUSCULAR | Status: AC
Start: 2016-06-03 — End: 2016-06-03
  Administered 2016-06-03: 4 mg via INTRAVENOUS
  Filled 2016-06-03: qty 2

## 2016-06-03 MED ORDER — PANTOPRAZOLE SODIUM 40 MG IV SOLR
40.0000 mg | Freq: Once | INTRAVENOUS | Status: AC
  Administered 2016-06-03: 40 mg via INTRAVENOUS
  Filled 2016-06-03: qty 40

## 2016-06-03 MED ORDER — MORPHINE SULFATE (PF) 4 MG/ML IV SOLN
4.0000 mg | Freq: Once | INTRAVENOUS | Status: AC
  Administered 2016-06-03: 4 mg via INTRAVENOUS

## 2016-06-03 MED ORDER — ONDANSETRON HCL 4 MG/2ML IJ SOLN
4.0000 mg | Freq: Four times a day (QID) | INTRAMUSCULAR | Status: DC | PRN
  Administered 2016-06-03 – 2016-06-04 (×2): 4 mg via INTRAVENOUS
  Filled 2016-06-03 (×2): qty 2

## 2016-06-03 MED ORDER — MORPHINE SULFATE (PF) 4 MG/ML IV SOLN
4.0000 mg | Freq: Once | INTRAVENOUS | Status: AC
  Administered 2016-06-03: 4 mg via INTRAVENOUS
  Filled 2016-06-03: qty 1

## 2016-06-03 MED ORDER — MORPHINE SULFATE (PF) 4 MG/ML IV SOLN
INTRAVENOUS | Status: AC
  Filled 2016-06-03: qty 1

## 2016-06-03 MED ORDER — VITAMIN K1 10 MG/ML IJ SOLN
10.0000 mg | Freq: Once | INTRAMUSCULAR | Status: AC
  Administered 2016-06-04: 10 mg via INTRAVENOUS
  Filled 2016-06-03: qty 1

## 2016-06-03 MED ORDER — DEXTROSE 5 % IV SOLN
2.0000 g | INTRAVENOUS | Status: DC
  Administered 2016-06-04 – 2016-06-06 (×3): 2 g via INTRAVENOUS
  Filled 2016-06-03 (×3): qty 2

## 2016-06-03 MED ORDER — OXYCODONE HCL 5 MG PO TABS
5.0000 mg | ORAL_TABLET | ORAL | Status: DC
  Filled 2016-06-03: qty 1

## 2016-06-03 MED ORDER — SODIUM CHLORIDE 0.9 % IV SOLN
50.0000 ug/h | INTRAVENOUS | Status: DC
  Administered 2016-06-03 – 2016-06-06 (×6): 50 ug/h via INTRAVENOUS
  Filled 2016-06-03 (×16): qty 1

## 2016-06-03 MED ORDER — ONDANSETRON HCL 4 MG PO TABS: 4.0000 mg | ORAL_TABLET | Freq: Four times a day (QID) | ORAL | Status: DC | PRN

## 2016-06-03 MED ORDER — SODIUM CHLORIDE 0.9 % IV SOLN
10.0000 mL/h | Freq: Once | INTRAVENOUS | Status: AC
  Administered 2016-06-04: 10 mL/h via INTRAVENOUS

## 2016-06-03 MED ORDER — DEXTROSE 5 % IV SOLN
2.0000 g | Freq: Once | INTRAVENOUS | Status: AC
  Administered 2016-06-04: 2 g via INTRAVENOUS
  Filled 2016-06-03: qty 2
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
## 2016-06-03 NOTE — Progress Notes (Signed)
Pharmacy Antibiotic Note  Benjamin Bradley is a 56 y.o. male admitted on 06/02/2016 with SBP.  Pharmacy has been consulted for ceftriaxone dosing.  Plan: Ceftriaxone 2 grams q 24 hours ordered  Height: 5\' 11"  (180.3 cm) Weight: 111 lb (50.3 kg) IBW/kg (Calculated) : 75.3  Temp (24hrs), Avg:98.6 F (37 C), Min:98.1 F (36.7 C), Max:98.9 F (37.2 C)   Recent Labs Lab 06/02/16 1800 05/07/2016 1706  WBC 8.8 6.8  CREATININE 1.90* 1.96*    Estimated Creatinine Clearance: 29.9 mL/min (A) (by C-G formula based on SCr of 1.96 mg/dL (H)).    Allergies  Allergen Reactions  . Nsaids Other (See Comments)    Cause severe hepatic reaction due to liver disease  . Vioxx [Rofecoxib] Other (See Comments)    Causes hepatic reaction due to liver disease  . Codeine Nausea And Vomiting    Antimicrobials this admission: ceftriaxone 5/29 >>    >>   Dose adjustments this admission:   Microbiology results: No micro    Thank you for allowing pharmacy to be a part of this patient's care.  Leighton Luster S 05/31/2016 9:54 PM

## 2016-06-03 NOTE — ED Triage Notes (Signed)
Patient from home [facility]. Wife reports patient has had dark bloody stools and coughed up blood since yesterday. Patient reports abdominal pain. History of cirrhosis and Hep C. Patient reports increased weakness and SOB with exertion over the last week. Patient states "I don't want to be admitted. I just want to be released under hospice home care." A&O x4. Given 100 mg Fentanyl and 500 ml NS by EMS prior to arrival.
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
## 2016-06-03 NOTE — ED Notes (Signed)
Pt passed bright red blood from rectum.  No stool seen.  Pt cleaned up and tolerated well.  Wife tearful. Wife signed consent for blood transfusion.

## 2016-06-03 NOTE — ED Notes (Signed)
ffp transfusion complete.  No reaction noted.  Pain and nausea meds given.  primedoc in with pt and family.  Pt waiting on admission.

## 2016-06-03 NOTE — ED Notes (Signed)
Pt alert.  Pt here for coughing up blood and dark stools.  Iv in place by ems.  md at bedside.  Pt states he just wants hospice care and no admission to hospital.  Family at bedside and md aware.

## 2016-06-03 NOTE — ED Notes (Signed)
prbc infusing.  ffp infusing.  nsr on monitor.  Pt sleepy.

## 2016-06-03 NOTE — ED Notes (Signed)
Pt coughed up bright red blood.  Pt reports pain all over.  Wife at bedside.  Iv meds infusing.  Pt alert and talking with nurse.

## 2016-06-03 NOTE — ED Notes (Signed)
No reaction noted with infusions.  Pt awake and talking.

## 2016-06-03 NOTE — ED Notes (Signed)
Pt coughed up blood in emesis bag

## 2016-06-03 NOTE — Consult Note (Addendum)
Name: Benjamin Bradley MRN: [id number] DOB: 1960/11/06    ADMISSION DATE:  06/06/2016 CONSULTATION DATE:  06-06-2016  REFERRING MD :  Dr.Huglemyer  CHIEF COMPLAINT:  Upper and lower GI bleed  BRIEF PATIENT DESCRIPTION: 56 year old male with liver cirrhosis, now presenting with upper and lower GIB  SIGNIFICANT EVENTS  5/30 Patient admitted to the ICU with upper and lower GIB  STUDIES:  None   HISTORY OF PRESENT ILLNESS:  Benjamin Bradley is a 56 year old male with known history of chronic kidney disease, diabetes mellitus, degenerative disc disease, HCV cirrhosis. Patient was admitted at [facility] from March 12-May 15. His hospital course was complicated by hepatic hydrothorax,  Pleural effusion,chest tube leak and  pleural space infection. Patient presents to ED on 5/29 complaining of coughing up LAD as well as noticing dark bloody stool for 2 days Patient's Hgb is down to 6.5 from 9.2 and INR- 4.02.  Patient was started on octreotide and protonix gtt.  GI was consulted.  Patient was admitted by the hospitalist team . CCM team was consulted for further management.  During his nearly 2 month stay at [facility], the patient was found to have a large right pleural effusion, he underwent an emergent thoracentesis on the right side with removal of 3 L due to respiratory distress. This is complicated by pneumothorax. This was complicated by recurrent pneumothorax, empyema. He had an endobronchial valves 5 placed by interventional pulmonology on 5/3, with resolution of his air leak. He is a history of hepatitis C,  failed treatment in 2010, and did not follow-up. He was followed by hepatology, received hepatitis B vaccine. Upon discharge she refused SNIF placement, and went home instead.     PAST MEDICAL HISTORY :   has a past medical history of Arthritis; Chronic kidney disease (6578,4696, 1997); Cirrhosis (HCC); Degenerative disc disease, cervical; Depression; Diabetes mellitus (2009);  Headache(784.0); Hemorrhoid; Hepatitis C (2000); Neuromuscular disorder (HCC); and Umbilical hernia.  has a past surgical history that includes Facial reconstruction surgery (1989); Cervical spine surgery (2003); and Eye surgery (1989). Prior to Admission medications   Medication Sig Start Date End Date Taking? Authorizing Provider  cetirizine (ZYRTEC) 10 MG tablet Take 10 mg by mouth daily.   Yes [provider]  midodrine (PROAMATINE) 5 MG tablet Take 15 mg by mouth 3 (three) times daily with meals.   Yes [provider]  ondansetron (ZOFRAN-ODT) 4 MG disintegrating tablet Take 4 mg by mouth every 8 (eight) hours as needed for nausea or vomiting.   Yes [provider]  sodium bicarbonate 650 MG tablet Take 1,300 mg by mouth 4 (four) times daily.   Yes [provider]  ALPHA LIPOIC ACID PO Take 2 capsules by mouth 2 (two) times daily with a meal. 600 mg/ capsule    [provider]  CALCIUM-MAGNESIUM-ZINC PO Take 1 capsule by mouth daily.    [provider]  furosemide (LASIX) 20 MG tablet Take 1 tablet (20 mg total) by mouth daily. Patient not taking: Reported on 2016/06/06 10/14/15 10/13/16  Myrna Blazer, MD  GOLDEN SEAL ROOT PO Take 1-4 capsules by mouth See admin instructions. Takes 1 to 4 capsule for one week then off a week 550 mg/capsule    [provider]  meperidine (DEMEROL) 50 MG tablet Take 50-100 mg by mouth every 6 (six) hours as needed. For pain    [provider]  potassium chloride (KLOR-CON) 20 MEQ packet Take 20 mEq by mouth  daily. Patient not taking: Reported on 12-29-16 10/14/15   Myrna BlazerSchaevitz, David Matthew, MD  vitamin E 400 UNIT capsule Take 400-800 Units by mouth daily.    [provider]   Allergies  Allergen Reactions  . Nsaids Other (See Comments)    Cause severe hepatic reaction due to liver disease  . Vioxx [Rofecoxib] Other (See Comments)    Causes hepatic reaction due to  liver disease  . Codeine Nausea And Vomiting    FAMILY HISTORY:  family history is not on file. SOCIAL HISTORY:  reports that he quit smoking about 8 years ago. His smoking use included Cigarettes. He has a 20.00 pack-year smoking history. He has never used smokeless tobacco. He reports that he does not drink alcohol or use drugs.  REVIEW OF SYSTEMS:   Constitutional: Negative for fever, chills, weight loss, malaise/fatigue and diaphoresis.  HENT: Negative for hearing loss, ear pain, nosebleeds, congestion, sore throat, neck pain, tinnitus and ear discharge.   Eyes: Negative for blurred vision, double vision, photophobia, pain, discharge and redness.  Respiratory: Negative for cough, hemoptysis, sputum production, shortness of breath, wheezing and stridor.   Cardiovascular: Negative for chest pain, palpitations, orthopnea, claudication, leg swelling and PND.  Gastrointestinal: Negative for heartburn, nausea, vomiting, abdominal pain, diarrhea, constipation, blood in stool and melena.  Genitourinary: Negative for dysuria, urgency, frequency, hematuria and flank pain.  Musculoskeletal: Negative for myalgias, back pain, joint pain and falls.  Skin: Negative for itching and rash.  Neurological: Negative for dizziness, tingling, tremors, sensory change, speech change, focal weakness, seizures, loss of consciousness, weakness and headaches.  Endo/Heme/Allergies: Negative for environmental allergies and polydipsia. Does not bruise/bleed easily.  SUBJECTIVE: Patient is resting at this time  VITAL SIGNS: Temp:  [98.1 F (36.7 C)-98.9 F (37.2 C)] 98.2 F (36.8 C) (05/29 2240) Pulse Rate:  [72-87] 79 (05/29 2245) Resp:  [9-26] 9 (05/29 2245) BP: (84-110)/(55-74) 85/72 (05/29 2245) SpO2:  [88 %-100 %] 100 % (05/29 2245) Weight:  [111 lb (50.3 kg)] 111 lb (50.3 kg) (05/29 1654)  PHYSICAL EXAMINATION: General:  Pale cachectic appearing, middle aged man found on the bed Neuro:  Awake, Alert  and Oriented HEENT:  AT.[place],PERRLA, Cardiovascular:  S1S2,Regular,no m/r/g noted Lungs:  Diminished air entry, no wheezes,crackles, rhonchi noted Abdomen:  Firm , Non tender, nondistended Musculoskeletal:  No edema, cyanosis noted Skin:  Warm.dry and intact.   Recent Labs Lab 06/02/16 1800 2016-03-17 1706  NA 131* 134*  K 4.4 4.8  CL 103 109  CO2 20* 20*  BUN 43* 52*  CREATININE 1.90* 1.96*  GLUCOSE 96 108*    Recent Labs Lab 06/02/16 1800 2016-03-17 1706  HGB 9.2* 6.5*  HCT 26.3* 18.8*  WBC 8.8 6.8  PLT 201 163   Dg Chest 1 View  Result Date: 12-29-16 CLINICAL DATA:  Acute onset of hemoptysis. Dark bloody stools. Acute onset of worsening generalized weakness and shortness of breath with exertion. Initial encounter. EXAM: CHEST 1 VIEW COMPARISON:  None. FINDINGS: A relatively large left-sided pleural effusion is noted. A small right pleural effusion is seen. Underlying airspace opacity may reflect pulmonary edema or pneumonia. Underlying mass cannot be excluded. No pneumothorax is seen. The cardiomediastinal silhouette is mildly enlarged. Overlying postoperative change is noted at the right side of the mediastinum. No acute osseous abnormalities are seen. Cervical spinal fusion hardware is noted. IMPRESSION: 1. Relatively large left-sided pleural effusion. Small right pleural effusion. Underlying airspace opacity may reflect pulmonary edema or pneumonia. Underlying mass cannot be excluded.  2. Mild cardiomegaly. Electronically Signed   By: Roanna Raider M.D.   On: 05/11/2016 21:55    ASSESSMENT / PLAN:   Large left sided pleural effusion   GI bleed in the setting of End Stage Cirrhosis  MSSA (methicillin susceptible Staphylococcus aureus) infection   Acute Blood Loss Anemia  Acute on Chronic Kidney disease   End Stage Cirrhosis  Abdominal Pain   Plan NPO Gastroenterology consulted We'll tolerate systolic blood pressure of greater than 90. Continue  octreotide/protonix Vitamin K x1 Transfuse 2 units of PRBC'S, 2 units of FFP Obtain Fibrinogen level, may need cryopreecipitate Ceftriaxone for SBP prophylaxis Ultrasound guided thoracentesis if breathing deteriorates. Will recheck INR in am Trend H/H Dilaudid for Pain Strict I/o Trend BMP       Bincy Varughese,AG-ACNP Pulmonary and Critical Care Medicine [facility]   05/06/2016, 11:18 PM  Patient seen and examined with NP, agree with findings, assessment, plan as above. Patient had a recent near Two-month admission at [facility], that time he was noted to have end-stage cirrhosis, right-sided pleural effusion, complicated by pneumothorax with air leaks persistently, treated with endobronchial valves.  I personally reviewed Chest x-ray imaging, this shows moderate to large left-sided pleural effusion. Currently the patient is awake, he appears in no acute respiratory distress. Lung sounds are decreased in the left base. GI has been consulted. Continue to monitor and transfuse as needed, FFP, vitamin K as needed. May require paracentesis.  Wells Guiles, M.D. 2016-06-09

## 2016-06-04 ENCOUNTER — Inpatient Hospital Stay: Payer: Medicare Other | Admitting: Anesthesiology

## 2016-06-04 ENCOUNTER — Encounter: Admission: EM | Disposition: E | Payer: Self-pay | Source: Home / Self Care | Attending: Specialist

## 2016-06-04 DIAGNOSIS — I8501 Esophageal varices with bleeding: Secondary | ICD-10-CM

## 2016-06-04 DIAGNOSIS — D689 Coagulation defect, unspecified: Secondary | ICD-10-CM

## 2016-06-04 DIAGNOSIS — K7469 Other cirrhosis of liver: Secondary | ICD-10-CM

## 2016-06-04 DIAGNOSIS — K922 Gastrointestinal hemorrhage, unspecified: Secondary | ICD-10-CM

## 2016-06-04 DIAGNOSIS — J90 Pleural effusion, not elsewhere classified: Secondary | ICD-10-CM

## 2016-06-04 DIAGNOSIS — K7460 Unspecified cirrhosis of liver: Secondary | ICD-10-CM

## 2016-06-04 HISTORY — PX: ESOPHAGOGASTRODUODENOSCOPY: SHX5428

## 2016-06-04 LAB — PREPARE FRESH FROZEN PLASMA
Unit division: 0
Unit division: 0

## 2016-06-04 LAB — CBC
HCT: 21.9 % — ABNORMAL LOW (ref 40.0–52.0)
Hemoglobin: 7.4 g/dL — ABNORMAL LOW (ref 13.0–18.0)
MCH: 33.3 pg (ref 26.0–34.0)
MCHC: 33.8 g/dL (ref 32.0–36.0)
MCV: 98.4 fL (ref 80.0–100.0)
PLATELETS: 146 10*3/uL — AB (ref 150–440)
RBC: 2.23 MIL/uL — ABNORMAL LOW (ref 4.40–5.90)
RDW: 21.6 % — ABNORMAL HIGH (ref 11.5–14.5)
WBC: 6.3 10*3/uL (ref 3.8–10.6)

## 2016-06-04 LAB — GLUCOSE, CAPILLARY
GLUCOSE-CAPILLARY: 71 mg/dL (ref 65–99)
GLUCOSE-CAPILLARY: 86 mg/dL (ref 65–99)
GLUCOSE-CAPILLARY: 88 mg/dL (ref 65–99)
Glucose-Capillary: 74 mg/dL (ref 65–99)
Glucose-Capillary: 94 mg/dL (ref 65–99)

## 2016-06-04 LAB — BPAM FFP
BLOOD PRODUCT EXPIRATION DATE: 201806032359
BLOOD PRODUCT EXPIRATION DATE: 201806032359
ISSUE DATE / TIME: 201805291940
ISSUE DATE / TIME: 201805292220
UNIT TYPE AND RH: 5100
Unit Type and Rh: 9500

## 2016-06-04 LAB — BASIC METABOLIC PANEL
Anion gap: 9 (ref 5–15)
BUN: 51 mg/dL — ABNORMAL HIGH (ref 6–20)
CALCIUM: 8.5 mg/dL — AB (ref 8.9–10.3)
CHLORIDE: 109 mmol/L (ref 101–111)
CO2: 20 mmol/L — AB (ref 22–32)
CREATININE: 1.97 mg/dL — AB (ref 0.61–1.24)
GFR calc non Af Amer: 36 mL/min — ABNORMAL LOW (ref >60)
GFR, EST AFRICAN AMERICAN: 42 mL/min — AB (ref >60)
Glucose, Bld: 77 mg/dL (ref 65–99)
Potassium: 4.9 mmol/L (ref 3.5–5.1)
SODIUM: 138 mmol/L (ref 135–145)

## 2016-06-04 LAB — PROTIME-INR
INR: 1.49
PROTHROMBIN TIME: 18.2 s — AB (ref 11.4–15.2)

## 2016-06-04 LAB — TROPONIN I: Troponin I: <0.03 ng/mL (ref <0.03)

## 2016-06-04 LAB — HEMOGLOBIN AND HEMATOCRIT, BLOOD
HEMATOCRIT: 25 % — AB (ref 40.0–52.0)
Hemoglobin: 8.5 g/dL — ABNORMAL LOW (ref 13.0–18.0)

## 2016-06-04 LAB — FIBRINOGEN: FIBRINOGEN: 112 mg/dL — AB (ref 210–475)

## 2016-06-04 LAB — MRSA PCR SCREENING: MRSA by PCR: NEGATIVE

## 2016-06-04 LAB — HEMOGLOBIN: HEMOGLOBIN: 7.5 g/dL — AB (ref 13.0–18.0)

## 2016-06-04 LAB — AMMONIA: AMMONIA: 24 umol/L (ref 9–35)

## 2016-06-04 SURGERY — EGD (ESOPHAGOGASTRODUODENOSCOPY)
Anesthesia: General

## 2016-06-04 MED ORDER — MEPERIDINE HCL 50 MG PO TABS: 50.0000 mg | ORAL_TABLET | ORAL | Status: DC | PRN

## 2016-06-04 MED ORDER — MIDODRINE HCL 5 MG PO TABS
15.0000 mg | ORAL_TABLET | Freq: Three times a day (TID) | ORAL | Status: DC
  Administered 2016-06-05 – 2016-06-08 (×9): 15 mg via ORAL
  Filled 2016-06-04 (×13): qty 3

## 2016-06-04 MED ORDER — PANTOPRAZOLE SODIUM 40 MG IV SOLR
40.0000 mg | Freq: Two times a day (BID) | INTRAVENOUS | Status: DC
  Administered 2016-06-04 – 2016-06-08 (×11): 40 mg via INTRAVENOUS
  Filled 2016-06-04 (×11): qty 40

## 2016-06-04 MED ORDER — SENNOSIDES-DOCUSATE SODIUM 8.6-50 MG PO TABS: 1.0000 | ORAL_TABLET | Freq: Every evening | ORAL | Status: DC | PRN

## 2016-06-04 MED ORDER — SODIUM CHLORIDE 0.9% FLUSH
3.0000 mL | Freq: Two times a day (BID) | INTRAVENOUS | Status: DC
  Administered 2016-06-04 – 2016-06-08 (×10): 3 mL via INTRAVENOUS

## 2016-06-04 MED ORDER — LORATADINE 10 MG PO TABS
10.0000 mg | ORAL_TABLET | Freq: Every day | ORAL | Status: DC
  Administered 2016-06-05 – 2016-06-08 (×3): 10 mg via ORAL
  Filled 2016-06-04 (×4): qty 1

## 2016-06-04 MED ORDER — ZOLPIDEM TARTRATE 5 MG PO TABS: 5.0000 mg | ORAL_TABLET | Freq: Every evening | ORAL | Status: DC | PRN

## 2016-06-04 MED ORDER — BISACODYL 5 MG PO TBEC: 5.0000 mg | DELAYED_RELEASE_TABLET | Freq: Every day | ORAL | Status: DC | PRN

## 2016-06-04 MED ORDER — PROMETHAZINE HCL 25 MG PO TABS: 25.0000 mg | ORAL_TABLET | Freq: Four times a day (QID) | ORAL | Status: DC | PRN

## 2016-06-04 MED ORDER — HYDROMORPHONE HCL 1 MG/ML IJ SOLN: 1.0000 mg | INTRAMUSCULAR | Status: DC | PRN

## 2016-06-04 MED ORDER — ALBUTEROL SULFATE (2.5 MG/3ML) 0.083% IN NEBU
2.5000 mg | INHALATION_SOLUTION | Freq: Four times a day (QID) | RESPIRATORY_TRACT | Status: DC | PRN
  Administered 2016-06-04: 2.5 mg via RESPIRATORY_TRACT
  Filled 2016-06-04: qty 3

## 2016-06-04 MED ORDER — SODIUM CHLORIDE 0.9 % IV SOLN: 10.0000 mL/h | Freq: Once | INTRAVENOUS | Status: DC

## 2016-06-04 MED ORDER — ONDANSETRON HCL 4 MG PO TABS: 4.0000 mg | ORAL_TABLET | ORAL | Status: DC | PRN

## 2016-06-04 MED ORDER — SODIUM CHLORIDE 0.9% FLUSH: 3.0000 mL | INTRAVENOUS | Status: DC | PRN

## 2016-06-04 MED ORDER — PROPOFOL 10 MG/ML IV BOLUS
INTRAVENOUS | Status: DC | PRN
  Administered 2016-06-04 (×2): 10 mg via INTRAVENOUS
  Administered 2016-06-04: 40 mg via INTRAVENOUS
  Administered 2016-06-04 (×2): 10 mg via INTRAVENOUS

## 2016-06-04 MED ORDER — SODIUM CHLORIDE 0.9 % IV SOLN: INTRAVENOUS | Status: DC

## 2016-06-04 MED ORDER — MORPHINE SULFATE (PF) 4 MG/ML IV SOLN: 1.0000 mg | INTRAVENOUS | Status: DC | PRN

## 2016-06-04 MED ORDER — SODIUM CHLORIDE 0.9 % IV SOLN
INTRAVENOUS | Status: DC | PRN
  Administered 2016-06-04: 10:00:00 via INTRAVENOUS

## 2016-06-04 MED ORDER — SODIUM CHLORIDE 0.9 % IV SOLN: 250.0000 mL | INTRAVENOUS | Status: DC | PRN

## 2016-06-04 MED ORDER — IPRATROPIUM BROMIDE 0.02 % IN SOLN: 0.5000 mg | Freq: Four times a day (QID) | RESPIRATORY_TRACT | Status: DC | PRN

## 2016-06-04 MED ORDER — VITAMIN E 180 MG (400 UNIT) PO CAPS
400.0000 [IU] | ORAL_CAPSULE | Freq: Every day | ORAL | Status: DC
  Administered 2016-06-05: 400 [IU] via ORAL
  Filled 2016-06-04 (×3): qty 1

## 2016-06-04 MED ORDER — MAGNESIUM CITRATE PO SOLN
1.0000 | Freq: Once | ORAL | Status: DC | PRN
  Filled 2016-06-04: qty 296

## 2016-06-04 MED ORDER — ONDANSETRON HCL 4 MG/2ML IJ SOLN
4.0000 mg | INTRAMUSCULAR | Status: DC | PRN
  Administered 2016-06-04 – 2016-06-05 (×4): 4 mg via INTRAVENOUS
  Filled 2016-06-04 (×3): qty 2

## 2016-06-04 MED ORDER — HYDROMORPHONE HCL 1 MG/ML IJ SOLN
0.5000 mg | INTRAMUSCULAR | Status: DC | PRN
  Administered 2016-06-04 (×3): 0.5 mg via INTRAVENOUS
  Administered 2016-06-05 – 2016-06-06 (×2): 1 mg via INTRAVENOUS
  Filled 2016-06-04 (×2): qty 0.5
  Filled 2016-06-04 (×3): qty 1
  Filled 2016-06-04: qty 0.5

## 2016-06-04 MED ORDER — SODIUM BICARBONATE 650 MG PO TABS
1300.0000 mg | ORAL_TABLET | Freq: Four times a day (QID) | ORAL | Status: DC
  Administered 2016-06-04 – 2016-06-08 (×13): 1300 mg via ORAL
  Filled 2016-06-04 (×16): qty 2

## 2016-06-04 MED ORDER — INSULIN ASPART 100 UNIT/ML ~~LOC~~ SOLN
0.0000 [IU] | SUBCUTANEOUS | Status: DC
  Administered 2016-06-06: 2 [IU] via SUBCUTANEOUS
  Administered 2016-06-07 (×2): 1 [IU] via SUBCUTANEOUS
  Administered 2016-06-07: 2 [IU] via SUBCUTANEOUS
  Administered 2016-06-08 (×6): 1 [IU] via SUBCUTANEOUS
  Administered 2016-06-08: 2 [IU] via SUBCUTANEOUS
  Administered 2016-06-09: 1 [IU] via SUBCUTANEOUS
  Filled 2016-06-04 (×7): qty 1
  Filled 2016-06-04 (×3): qty 2
  Filled 2016-06-04 (×2): qty 1

## 2016-06-04 MED ORDER — RIFAXIMIN 550 MG PO TABS
550.0000 mg | ORAL_TABLET | Freq: Two times a day (BID) | ORAL | Status: DC
  Administered 2016-06-04 – 2016-06-05 (×2): 550 mg via ORAL
  Filled 2016-06-04 (×2): qty 1

## 2016-06-04 NOTE — Transfer of Care (Signed)
Immediate Anesthesia Transfer of Care Note  Patient: Benjamin Bradley  Procedure(s) Performed: Procedure(s): ESOPHAGOGASTRODUODENOSCOPY (EGD) (N/A)  Patient Location: ICU  Anesthesia Type:General  Level of Consciousness: awake and alert   Airway & Oxygen Therapy: Patient Spontanous Breathing and Patient connected to nasal cannula oxygen  Post-op Assessment: Report given to RN and Post -op Vital signs reviewed and stable  Post vital signs: Reviewed and stable  Last Vitals:  Vitals:   January 12, 2016 0900 January 12, 2016 1000  BP: 98/73 103/80  Pulse: 70 73  Resp: 12 (!) 21  Temp:      Last Pain:  Vitals:   January 12, 2016 0829  TempSrc: Axillary  PainSc:          Complications: No apparent anesthesia complications
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
## 2016-06-04 NOTE — Progress Notes (Signed)
PT Cancellation Note  Patient Details Name: Leticia ClasHarold D Beam MRN: [id number] DOB: 07/04/1960   Cancelled Treatment:    Reason Eval/Treat Not Completed: Patient at procedure or test/unavailable.  Pt for endoscopy.  Will continue to follow acutely.   Encarnacion ChuAshley Gracin Mcpartland PT, DPT 05/28/2016, 11:34 AM

## 2016-06-04 NOTE — Anesthesia Post-op Follow-up Note (Signed)
Anesthesia QCDR form completed.        
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
## 2016-06-04 NOTE — Progress Notes (Signed)
Bedside endoscopy completed.  Endo nurses at bedside at this time with patient.  Wife at bedside and Dr. Tobi BastosAnna speaking with her.

## 2016-06-04 NOTE — Progress Notes (Signed)
No vomiting during shift.  2 maroon bowel movements.  NSR per cardiac monitor with stable blood pressure.  RN made Dr. Tobi BastosAnna aware of hgb of 7.5 and MD gave no orders for blood transfusion and stated patient can have ice chips.

## 2016-06-04 NOTE — Progress Notes (Signed)
Wife unavailable this afternoon. Not at bedside. Left voicemail. Patient drowsy and states "it's been a busy day." This NP will f/u in AM.   NO CHARGE  Vennie HomansMegan Estoria Geary, FNP-C Palliative Medicine Team  Phone: [phone] Fax: [phone]

## 2016-06-04 NOTE — Progress Notes (Signed)
PT Cancellation Note  Patient Details Name: Leticia ClasHarold D Mirarchi MRN: [id number] DOB: 23-Oct-1960   Cancelled Treatment:    Reason Eval/Treat Not Completed: Other (comment).  Spoke with RN who requested to hold PT for now as pt has been nauseous and is just now getting some rest.  Will continue to follow acutely.   Encarnacion ChuAshley Abashian PT, DPT 2016-12-22, 2:25 PM

## 2016-06-04 NOTE — Anesthesia Preprocedure Evaluation (Signed)
Anesthesia Evaluation  Patient identified by MRN, date of birth, ID band Patient awake    Reviewed: Allergy & Precautions, NPO status , Patient's Chart, lab work & pertinent test results  History of Anesthesia Complications Negative for: history of anesthetic complications  Airway Mallampati: II  TM Distance: >3 FB Neck ROM: Full    Dental  (+) Poor Dentition   Pulmonary sleep apnea , neg COPD, former smoker,    breath sounds clear to auscultation- rhonchi (-) wheezing      Cardiovascular (-) hypertension(-) CAD and (-) Past MI  Rhythm:Regular Rate:Normal - Systolic murmurs and - Diastolic murmurs    Neuro/Psych  Headaches, PSYCHIATRIC DISORDERS Depression    GI/Hepatic negative GI ROS, (+) Cirrhosis       , Hepatitis -, C  Endo/Other  diabetes  Renal/GU Renal InsufficiencyRenal disease     Musculoskeletal  (+) Arthritis ,   Abdominal (+) - obese,   Peds  Hematology negative hematology ROS (+)   Anesthesia Other Findings Past Medical History: No date: Arthritis     Comment: Neck and spine [id number], 1997: Chronic kidney disease     Comment: kidney stones No date: Cirrhosis (HCC) No date: Degenerative disc disease, cervical No date: Depression 2009: Diabetes mellitus     Comment: type 2 No date: Headache(784.0)     Comment: magraines takes Demerol No date: Hemorrhoid 2000: Hepatitis C     Comment: Hep C No date: Neuromuscular disorder (HCC)     Comment: 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 No date: Umbilical hernia   Reproductive/Obstetrics                             Anesthesia Physical Anesthesia Plan  ASA: III and emergent  Anesthesia Plan: General   Post-op Pain Management:    Induction: Intravenous  Airway Management Planned: Natural Airway  Additional Equipment:   Intra-op Plan:   Post-operative Plan:   Informed Consent: I have reviewed the patients History and  Physical, chart, labs and discussed the procedure including the risks, benefits and alternatives for the proposed anesthesia with the patient or authorized representative who has indicated his/her understanding and acceptance.   Dental advisory given  Plan Discussed with: CRNA and Anesthesiologist  Anesthesia Plan Comments:         Anesthesia Quick Evaluation

## 2016-06-04 NOTE — Progress Notes (Signed)
Patient ID: Leticia ClasHarold D Schank, male   DOB: June 26, 1960, 56 y.o.   MRN: [id number]    Advance care planning   Present The patient, Mr. Ralph LeydenHarold Lisowski Patient's wife, Mrs. Henrene HawkingAnnie Sedor Dr Winona LegatoVaickute  Diagnoses : acute posthemorrhagic anemia Variceal bleed Altered mental state, suspected hepatic encephalopathy Hypertension Renal failure Pleural effusion/ascites Coagulopathy Hyponatremia.  Liver cirrhosis   I discussed with the patient's wife all medical diagnoses, risks as well as benefits of multiple therapies and likely decline of the patient's condition in the nearest future. I answered all questions to the best of my ability. I suggested to consult palliative care  to determine goals of care and patient's wife was agreeable. I explained to patient's wife that patient's mental status may improve on Xifaxan tomorrow and he may be able to participate in the discussion. Patient is full code at this time    Time spent 20 minutes on discussions

## 2016-06-04 NOTE — Progress Notes (Signed)
[facility] - [place] at [facility]   PATIENT NAME: Benjamin Bradley    MR#:  [id number]  DATE OF BIRTH:  [birth year]  SUBJECTIVE:  CHIEF COMPLAINT:   Chief Complaint  Patient presents with  . Hemoptysis   The patient is 56 year old Caucasian male with medical history significant for history of liver cirrhosis, hep C, CAD, diabetes, depression, arthritis, arthritis, who presents to the hospital with complaints of hematemesis, tarry stool. On arrival to the hospital patient was noted to have hemoglobin level of 6.5. He was admitted for octreotide infusion, initiated on PPI intravenously. He underwent EGD by Dr. Tobi Bastos, revealing wheezing grade 3 esophageal varices, 9 bands were placed , bleeding has stopped. Patient is somnolent and not able to review of systems. Intermittently hypotensive with systolic blood pressure in 90s. Chest x-ray revealed left pleural effusion. Patient admits of shortness of breath Review of Systems  Unable to perform ROS: Mental acuity    VITAL SIGNS: Blood pressure 116/79, pulse 85, temperature 97.5 F (36.4 C), temperature source Axillary, resp. rate (!) 21, height 5\' 11"  (1.803 m), weight 59.2 kg (130 lb 8.2 oz), SpO2 99 %.  PHYSICAL EXAMINATION:   GENERAL:  56 y.o.-year-old patient lying in the bed with no acute distress, somnolent, barely opens eyes, answers few questions appropriately.   Thin, wasted muscle mass EYES: Pupils equal, round, reactive to light and accommodation. No scleral icterus. Extraocular muscles intact.  HEENT: Head atraumatic, normocephalic. Oropharynx and nasopharynx clear.  NECK:  Supple, no jugular venous distention. No thyroid enlargement, no tenderness.  LUNGS: Diminished breath sounds on the left, some diminished on the right, however, able to hear breath sounds, no wheezing, rales,rhonchi or crepitation. Intermittent use of accessory muscles of respiration, decreased respiratory effort.  CARDIOVASCULAR: S1,  S2 normal. No murmurs, rubs, or gallops.  ABDOMEN: Soft, nontender, nondistended. Bowel sounds present. No organomegaly or mass.  EXTREMITIES: Trace to 1+ lower extremity and pedal edema, no cyanosis, or clubbing.  NEUROLOGIC: Cranial nerves II through XII are intact. Muscle strength 5/5 in all extremities. Sensation intact. Gait not checked.  PSYCHIATRIC: The patient is alert and oriented x 3.  SKIN: No obvious rash, lesion, or ulcer.   ORDERS/RESULTS REVIEWED:   CBC  Recent Labs Lab 06/02/16 1800 05/06/2016 1706 06/03/2016 0132 05/16/2016 0558  WBC 8.8 6.8 6.3  --   HGB 9.2* 6.5* 7.4* 8.5*  HCT 26.3* 18.8* 21.9* 25.0*  PLT 201 163 146*  --   MCV 103.0* 105.0* 98.4  --   MCH 35.9* 36.2* 33.3  --   MCHC 34.9 34.5 33.8  --   RDW 16.6* 16.2* 21.6*  --   LYMPHSABS 1.7 1.4  --   --   MONOABS 0.9 0.8  --   --   EOSABS 0.1 0.1  --   --   BASOSABS 0.1 0.0  --   --    ------------------------------------------------------------------------------------------------------------------  Chemistries   Recent Labs Lab 06/02/16 1800 05/16/2016 1706 05/17/2016 0132  NA 131* 134* 138  K 4.4 4.8 4.9  CL 103 109 109  CO2 20* 20* 20*  GLUCOSE 96 108* 77  BUN 43* 52* 51*  CREATININE 1.90* 1.96* 1.97*  CALCIUM 8.7* 8.3* 8.5*  AST  --  30  --   ALT  --  14*  --   ALKPHOS  --  49  --   BILITOT  --  1.5*  --    ------------------------------------------------------------------------------------------------------------------ estimated creatinine clearance is 35.1 mL/min (A) (by  C-G formula based on SCr of 1.97 mg/dL (H)). ------------------------------------------------------------------------------------------------------------------ No results for input(s): TSH, T4TOTAL, T3FREE, THYROIDAB in the last 72 hours.  Invalid input(s): FREET3  Cardiac Enzymes  Recent Labs Lab 05/22/2016 0005 05/17/2016 0132 05/08/2016 1342  TROPONINI <0.03 <0.03 <0.03    ------------------------------------------------------------------------------------------------------------------ Invalid input(s): POCBNP ---------------------------------------------------------------------------------------------------------------  RADIOLOGY: Dg Chest 1 View  Result Date: 05/21/2016 CLINICAL DATA:  Acute onset of hemoptysis. Dark bloody stools. Acute onset of worsening generalized weakness and shortness of breath with exertion. Initial encounter. EXAM: CHEST 1 VIEW COMPARISON:  None. FINDINGS: A relatively large left-sided pleural effusion is noted. A small right pleural effusion is seen. Underlying airspace opacity may reflect pulmonary edema or pneumonia. Underlying mass cannot be excluded. No pneumothorax is seen. The cardiomediastinal silhouette is mildly enlarged. Overlying postoperative change is noted at the right side of the mediastinum. No acute osseous abnormalities are seen. Cervical spinal fusion hardware is noted. IMPRESSION: 1. Relatively large left-sided pleural effusion. Small right pleural effusion. Underlying airspace opacity may reflect pulmonary edema or pneumonia. Underlying mass cannot be excluded. 2. Mild cardiomegaly. Electronically Signed   By: Roanna RaiderJeffery  Chang M.D.   On: 05/14/2016 21:55    EKG:  Orders placed or performed during the hospital encounter of 05/16/2016  . EKG 12-Lead    ASSESSMENT AND PLAN:  Active Problems:   Upper GI bleed  #1. Acute Variceal bleeding, status post EGD, 9 bands placed 06/05/2016 by Dr. Tobi BastosAnna, bleeding has stopped, continue octreotide, Protonix intravenously, follow clinically, continue patient nothing by mouth until gastroenterologist recommendations #2. Acute posthemorrhagic anemia, status post 2 units of packed red blood cell transfusion, follow hemoglobin level and transfuse patient as needed #3. Altered mental state, suspected hepatic encephalopathy, getting ammonia level checked, initiate patient on Xifaxan #4.  Hypotension, initiate patient on midodrine as needed #5. Left pleural effusion, ascites, unable to use diuretics due to renal failure, hypotension, with palliative care involved for further recommendations, discussed with patient's wife extensively #6. Chronic renal failure, follow closely, seems to be stable at this time #7. Hyponatremia, resolved #8 . 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   DRUG ALLERGIES:  Allergies  Allergen Reactions  . Nsaids Other (See Comments)    Cause severe hepatic reaction due to liver disease  . Vioxx [Rofecoxib] Other (See Comments)    Causes hepatic reaction due to liver disease  . Codeine Nausea And Vomiting    CODE STATUS:     Code Status Orders        Start     Ordered   05/12/2016 0029  Full code  Continuous     05/10/2016 0028    Code Status History    Date Active Date Inactive Code Status Order ID Comments User Context   05/13/2016 12:17 AM 06/02/2016 12:17 AM Full Code [id number]  Tonye RoyaltyHugelmeyer, Alexis, DO Inpatient   05/15/2016 12:17 AM 05/31/2016 12:28 AM DNR [id number]  Tonye RoyaltyHugelmeyer, Alexis, DO Inpatient   05/22/2016  5:06 PM 05/08/2016  9:24 PM DNR [id number]  Sharman CheekStafford, Phillip, MD ED      TOTAL TIME TAKING CARE OF THIS PATIENT: 40 minutes.    Katharina CaperVAICKUTE,Shevette Bess M.D on 05/10/2016 at 3:12 PM  Between 7am to 6pm - Pager - [phone]  After 6pm go to [email or web address] - password [email or web address] [facility]  [facility] [place]  [facility]  Office  [phone]  CC: Primary care physician; Patient, No Pcp Per

## 2016-06-04 NOTE — Op Note (Signed)
[facility] Gastroenterology Patient Name: Benjamin Bradley Procedure Date: 06-07-16 10:11 AM MRN: [id number] Account #: [id number] Date of Birth: 1960/01/14 Admit Type: Outpatient Age: 56 Room: [facility] ENDO ROOM 1 Gender: Male Note Status: Finalized Procedure:            Upper GI endoscopy Indications:          Hematemesis Providers:            Wyline Mood MD, MD Medicines:            Monitored Anesthesia Care Complications:        No immediate complications. Procedure:            Pre-Anesthesia Assessment:                       - Prior to the procedure, a History and Physical was                        performed, and patient medications, allergies and                        sensitivities were reviewed. The patient's tolerance of                        previous anesthesia was reviewed.                       - The risks and benefits of the procedure and the                        sedation options and risks were discussed with the                        patient. All questions were answered and informed                        consent was obtained.                       - ASA Grade Assessment: IV - A patient with severe                        systemic disease that is a constant threat to life.                       After obtaining informed consent, the endoscope was                        passed under direct vision. Throughout the procedure,                        the patient's blood pressure, pulse, and oxygen                        saturations were monitored continuously. The Endoscope                        was introduced through the mouth, and advanced to the                        third part of duodenum. The upper  GI endoscopy was                        accomplished with ease. The patient tolerated the                        procedure well. Findings:      Red blood was found in the entire examined stomach.      Limited visualization- no gastric varices seen  Red blood was found in the entire duodenum.      Four columns of oozing grade III varices were found in the middle third       of the esophagus and in the lower third of the esophagus, 30 to 40 cm       from the incisors. They were 10 mm in largest diameter. Stigmata of       recent bleeding were evident and red wale signs were present. Nine bands       were successfully placed with complete eradication, resulting in       deflation of varices. Bleeding had stopped at the end of the procedure. Impression:           - Red blood in the entire stomach.                       - Blood in the entire examined duodenum.                       - Bleeding grade III esophageal varices. Completely                        eradicated. Banded.                       - No specimens collected. Recommendation:       - Observe patient in GI recovery unit for ongoing care.                       - NPO for 1 day.                       - Continue present medications.                       - No NG tubes , can start on clears tomorrow evening if                        not bleeding                       Continue octreotide for 72 hours in total                       Repeat EGD in 2 weeks for repeat banding Procedure Code(s):    --- Professional ---                       [id number], Esophagogastroduodenoscopy, flexible, transoral;                        with band ligation of esophageal/gastric varices Diagnosis Code(s):    --- Professional ---  K92.2, Gastrointestinal hemorrhage, unspecified                       I85.01, Esophageal varices with bleeding                       K92.0, Hematemesis CPT copyright 2016 American Medical Association. All rights reserved. The codes documented in this report are preliminary and upon coder review may  be revised to meet current compliance requirements. Wyline Mood, MD Wyline Mood MD, MD 05/26/2016 10:51:18 AM This report has been signed electronically. Number of  Addenda: 0 Note Initiated On: 05/26/2016 10:11 AM      [facility]

## 2016-06-04 NOTE — Anesthesia Postprocedure Evaluation (Signed)
Anesthesia Post Note  Patient: Benjamin Bradley  Procedure(s) Performed: Procedure(s) (LRB): ESOPHAGOGASTRODUODENOSCOPY (EGD) (N/A)  Patient location during evaluation: SICU Anesthesia Type: General Level of consciousness: awake and alert Pain management: pain level controlled Vital Signs Assessment: post-procedure vital signs reviewed and stable Respiratory status: spontaneous breathing Cardiovascular status: stable Anesthetic complications: no     Last Vitals:  Vitals:   05/17/2016 1042 06/03/2016 1048  BP: 108/79 98/71  Pulse:    Resp: (!) 25 (!) 21  Temp:      Last Pain:  Vitals:   05/07/2016 0829  TempSrc: Axillary  PainSc:                  Benjamin Bradley

## 2016-06-04 NOTE — Progress Notes (Signed)
RN spoke with Dr. Tobi BastosAnna and asked about patient having ice chips.  MD stated that patient could have ice chips around 7 pm if patient has not thrown up before then.

## 2016-06-04 NOTE — Progress Notes (Addendum)
Pharmacy Antibiotic Note  Benjamin Bradley is a 56 y.o. male with a h/o end-stage cirrhosis admitted on 05/19/2016 with GIB.  Pharmacy has been consulted for ceftriaxone dosing for SBP prophylaxis.  Plan: Ceftriaxone 2 grams q 24 hours.  Height: 5\' 11"  (180.3 cm) Weight: 130 lb 8.2 oz (59.2 kg) IBW/kg (Calculated) : 75.3  Temp (24hrs), Avg:98.1 F (36.7 C), Min:97.3 F (36.3 C), Max:98.9 F (37.2 C)   Recent Labs Lab 06/02/16 1800 05/19/2016 1706 05/11/2016 0132  WBC 8.8 6.8 6.3  CREATININE 1.90* 1.96* 1.97*    Estimated Creatinine Clearance: 35.1 mL/min (A) (by C-G formula based on SCr of 1.97 mg/dL (H)).    Allergies  Allergen Reactions  . Nsaids Other (See Comments)    Cause severe hepatic reaction due to liver disease  . Vioxx [Rofecoxib] Other (See Comments)    Causes hepatic reaction due to liver disease  . Codeine Nausea And Vomiting    Antimicrobials this admission: ceftriaxone 5/29 >>   Dose adjustments this admission:   Microbiology results: 5/30 MRSA PCR: negative  Thank you for allowing pharmacy to be a part of this patient's care.  Luisa HartChristy, Meryl Ponder D 06/03/2016 9:35 AM

## 2016-06-05 ENCOUNTER — Encounter: Payer: Self-pay | Admitting: Gastroenterology

## 2016-06-05 DIAGNOSIS — K769 Liver disease, unspecified: Secondary | ICD-10-CM

## 2016-06-05 DIAGNOSIS — J918 Pleural effusion in other conditions classified elsewhere: Secondary | ICD-10-CM

## 2016-06-05 DIAGNOSIS — Z7189 Other specified counseling: Secondary | ICD-10-CM

## 2016-06-05 DIAGNOSIS — D689 Coagulation defect, unspecified: Secondary | ICD-10-CM

## 2016-06-05 DIAGNOSIS — K7460 Unspecified cirrhosis of liver: Secondary | ICD-10-CM

## 2016-06-05 DIAGNOSIS — K922 Gastrointestinal hemorrhage, unspecified: Secondary | ICD-10-CM

## 2016-06-05 DIAGNOSIS — Z515 Encounter for palliative care: Secondary | ICD-10-CM

## 2016-06-05 LAB — COMPREHENSIVE METABOLIC PANEL
ALT: 16 U/L — ABNORMAL LOW (ref 17–63)
ANION GAP: 6 (ref 5–15)
AST: 31 U/L (ref 15–41)
Albumin: 3.3 g/dL — ABNORMAL LOW (ref 3.5–5.0)
Alkaline Phosphatase: 46 U/L (ref 38–126)
BILIRUBIN TOTAL: 1.6 mg/dL — AB (ref 0.3–1.2)
BUN: 58 mg/dL — ABNORMAL HIGH (ref 6–20)
CHLORIDE: 112 mmol/L — AB (ref 101–111)
CO2: 21 mmol/L — ABNORMAL LOW (ref 22–32)
Calcium: 8.4 mg/dL — ABNORMAL LOW (ref 8.9–10.3)
Creatinine, Ser: 2.18 mg/dL — ABNORMAL HIGH (ref 0.61–1.24)
GFR, EST AFRICAN AMERICAN: 37 mL/min — AB (ref >60)
GFR, EST NON AFRICAN AMERICAN: 32 mL/min — AB (ref >60)
Glucose, Bld: 97 mg/dL (ref 65–99)
POTASSIUM: 5.4 mmol/L — AB (ref 3.5–5.1)
Sodium: 139 mmol/L (ref 135–145)
TOTAL PROTEIN: 5.6 g/dL — AB (ref 6.5–8.1)

## 2016-06-05 LAB — GLUCOSE, CAPILLARY
GLUCOSE-CAPILLARY: 92 mg/dL (ref 65–99)
Glucose-Capillary: 67 mg/dL (ref 65–99)
Glucose-Capillary: 75 mg/dL (ref 65–99)
Glucose-Capillary: 78 mg/dL (ref 65–99)
Glucose-Capillary: 86 mg/dL (ref 65–99)
Glucose-Capillary: 94 mg/dL (ref 65–99)
Glucose-Capillary: 94 mg/dL (ref 65–99)

## 2016-06-05 LAB — HEMOGLOBIN
HEMOGLOBIN: 6.6 g/dL — AB (ref 13.0–18.0)
Hemoglobin: 7.7 g/dL — ABNORMAL LOW (ref 13.0–18.0)

## 2016-06-05 LAB — HIV ANTIBODY (ROUTINE TESTING W REFLEX): HIV SCREEN 4TH GENERATION: NONREACTIVE

## 2016-06-05 LAB — BASIC METABOLIC PANEL
Anion gap: 10 (ref 5–15)
BUN: 58 mg/dL — ABNORMAL HIGH (ref 6–20)
CHLORIDE: 108 mmol/L (ref 101–111)
CO2: 19 mmol/L — ABNORMAL LOW (ref 22–32)
CREATININE: 2.36 mg/dL — AB (ref 0.61–1.24)
Calcium: 8.6 mg/dL — ABNORMAL LOW (ref 8.9–10.3)
GFR calc Af Amer: 34 mL/min — ABNORMAL LOW (ref >60)
GFR, EST NON AFRICAN AMERICAN: 29 mL/min — AB (ref >60)
Glucose, Bld: 86 mg/dL (ref 65–99)
POTASSIUM: 5.2 mmol/L — AB (ref 3.5–5.1)
Sodium: 137 mmol/L (ref 135–145)

## 2016-06-05 LAB — PROTIME-INR
INR: 1.49
PROTHROMBIN TIME: 18.2 s — AB (ref 11.4–15.2)

## 2016-06-05 LAB — PREPARE RBC (CROSSMATCH)

## 2016-06-05 MED ORDER — SODIUM CHLORIDE 0.9 % IV SOLN
Freq: Once | INTRAVENOUS | Status: AC
  Administered 2016-06-05: via INTRAVENOUS

## 2016-06-05 MED ORDER — PHENOL 1.4 % MT LIQD
2.0000 | OROMUCOSAL | Status: DC | PRN
  Administered 2016-06-05: 2 via OROMUCOSAL
  Filled 2016-06-05: qty 177

## 2016-06-05 MED ORDER — ALBUMIN HUMAN 25 % IV SOLN
25.0000 g | Freq: Four times a day (QID) | INTRAVENOUS | Status: DC
  Administered 2016-06-05 – 2016-06-06 (×2): 25 g via INTRAVENOUS
  Filled 2016-06-05 (×7): qty 100

## 2016-06-05 MED ORDER — SODIUM POLYSTYRENE SULFONATE 15 GM/60ML PO SUSP
30.0000 g | Freq: Once | ORAL | Status: AC
  Administered 2016-06-05: 30 g via ORAL
  Filled 2016-06-05: qty 120

## 2016-06-05 MED ORDER — ALBUMIN HUMAN 25 % IV SOLN
12.5000 g | Freq: Once | INTRAVENOUS | Status: AC
  Administered 2016-06-05: 12.5 g via INTRAVENOUS
  Filled 2016-06-05: qty 50

## 2016-06-05 MED ORDER — VITAMIN K1 10 MG/ML IJ SOLN
10.0000 mg | Freq: Every day | INTRAMUSCULAR | Status: AC
Start: 2016-06-05 — End: 2016-06-07
  Administered 2016-06-05 – 2016-06-07 (×3): 10 mg via SUBCUTANEOUS
  Filled 2016-06-05 (×4): qty 1

## 2016-06-05 NOTE — Progress Notes (Signed)
Pharmacy Antibiotic Note  Benjamin Bradley is a 56 y.o. male with a h/o end-stage cirrhosis admitted on 05/07/2016 with GIB.  Pharmacy has been consulted for ceftriaxone dosing for SBP prophylaxis.  Plan: Ceftriaxone 2 grams q 24 hours. Plan is a total of 5 days of therapy per GI. Peritoneal fluid sent for analysis.   Height: 5\' 11"  (180.3 cm) Weight: 130 lb 8.2 oz (59.2 kg) IBW/kg (Calculated) : 75.3  Temp (24hrs), Avg:98.2 F (36.8 C), Min:97.7 F (36.5 C), Max:98.5 F (36.9 C)   Recent Labs Lab 06/02/16 1800 05/09/2016 1706 05/22/2016 0132 06/05/16 0313  WBC 8.8 6.8 6.3  --   CREATININE 1.90* 1.96* 1.97* 2.18*    Estimated Creatinine Clearance: 31.7 mL/min (A) (by C-G formula based on SCr of 2.18 mg/dL (H)).    Allergies  Allergen Reactions  . Nsaids Other (See Comments)    Cause severe hepatic reaction due to liver disease  . Vioxx [Rofecoxib] Other (See Comments)    Causes hepatic reaction due to liver disease  . Codeine Nausea And Vomiting    Antimicrobials this admission: ceftriaxone 5/29 >>   Dose adjustments this admission:   Microbiology results: 5/30 MRSA PCR: negative  Thank you for allowing pharmacy to be a part of this patient's care.  Luisa HartChristy, Stefanee Mckell D 06/05/2016 2:21 PM

## 2016-06-05 NOTE — Progress Notes (Signed)
Perineal area becoming raw and tender from multiple stools. Discussed with D.Blakney NP. Orders for condom catheter and flexiseal obtained. Otherwise rough day. Multiple maroon colored stools.Patient withdrawn, depressed, and irritable. Wife extremely upset and teary. Discussed patient's processive cirrhosis and physical decline. Pallative care in for consult.

## 2016-06-05 NOTE — Progress Notes (Signed)
Name: Benjamin Bradley MRN: [id number] DOB: Oct 22, 1960    ADMISSION DATE:  05/22/2016 CONSULTATION DATE:  05/15/2016   SUBJECTIVE:  Patient is lethargic but easily arousable, he wakes up, but does not want to answer questions.    PAST MEDICAL HISTORY :    Prior to Admission medications   Medication Sig Start Date End Date Taking? Authorizing Provider  cetirizine (ZYRTEC) 10 MG tablet Take 10 mg by mouth daily.   Yes [provider]  midodrine (PROAMATINE) 5 MG tablet Take 15 mg by mouth 3 (three) times daily with meals.   Yes [provider]  ondansetron (ZOFRAN-ODT) 4 MG disintegrating tablet Take 4 mg by mouth every 8 (eight) hours as needed for nausea or vomiting.   Yes [provider]  sodium bicarbonate 650 MG tablet Take 1,300 mg by mouth 4 (four) times daily.   Yes [provider]  ALPHA LIPOIC ACID PO Take 2 capsules by mouth 2 (two) times daily with a meal. 600 mg/ capsule    [provider]  CALCIUM-MAGNESIUM-ZINC PO Take 1 capsule by mouth daily.    [provider]  furosemide (LASIX) 20 MG tablet Take 1 tablet (20 mg total) by mouth daily. Patient not taking: Reported on 05/10/2016 10/14/15 10/13/16  Myrna Blazer, MD  GOLDEN SEAL ROOT PO Take 1-4 capsules by mouth See admin instructions. Takes 1 to 4 capsule for one week then off a week 550 mg/capsule    [provider]  meperidine (DEMEROL) 50 MG tablet Take 50-100 mg by mouth every 6 (six) hours as needed. For pain    [provider]  potassium chloride (KLOR-CON) 20 MEQ packet Take 20 mEq by mouth daily. Patient not taking: Reported on 05/16/2016 10/14/15   Myrna Blazer, MD  vitamin E 400 UNIT capsule Take 400-800 Units by mouth daily.    [provider]   Allergies  Allergen Reactions  . Nsaids Other (See Comments)    Cause severe hepatic reaction due to liver disease  . Vioxx [Rofecoxib] Other (See Comments)   Causes hepatic reaction due to liver disease  . Codeine Nausea And Vomiting  REVIEW OF SYSTEMS:   Could not be obtained, the patient does not want to answer questions.   VITAL SIGNS: Temp:  [97.5 F (36.4 C)-98.5 F (36.9 C)] 98.5 F (36.9 C) (05/31 0400) Pulse Rate:  [73-95] 78 (05/31 0921) Resp:  [8-29] 21 (05/31 0600) BP: (91-129)/(49-103) 97/64 (05/31 0921) SpO2:  [87 %-100 %] 100 % (05/31 0921)  PHYSICAL EXAMINATION: General:  Pale cachectic appearing,  Neuro:  Awake, Alert and Oriented, but not wanting to talk HEENT:  AT.[place],PERRLA, Cardiovascular:  S1S2,Regular,no m/r/g noted Lungs:  Diminished air entry, no wheezes,crackles, rhonchi noted Abdomen:  Firm , Non tender, nondistended Musculoskeletal:  No edema, cyanosis noted Skin:  Warm.dry and intact.   Recent Labs Lab 05/23/2016 1706 2016-06-13 0132 06/05/16 0313  NA 134* 138 139  K 4.8 4.9 5.4*  CL 109 109 112*  CO2 20* 20* 21*  BUN 52* 51* 58*  CREATININE 1.96* 1.97* 2.18*  GLUCOSE 108* 77 97    Recent Labs Lab 06/02/16 1800 06/05/2016 1706 06/13/2016 0132 13-Jun-2016 0558 06-13-2016 1800  HGB 9.2* 6.5* 7.4* 8.5* 7.5*  HCT 26.3* 18.8* 21.9* 25.0*  --   WBC 8.8 6.8 6.3  --   --   PLT 201 163 146*  --   --    Dg Chest 1 View  Result Date: 06/04/2016  CLINICAL DATA:  Acute onset of hemoptysis. Dark bloody stools. Acute onset of worsening generalized weakness and shortness of breath with exertion. Initial encounter. EXAM: CHEST 1 VIEW COMPARISON:  None. FINDINGS: A relatively large left-sided pleural effusion is noted. A small right pleural effusion is seen. Underlying airspace opacity may reflect pulmonary edema or pneumonia. Underlying mass cannot be excluded. No pneumothorax is seen. The cardiomediastinal silhouette is mildly enlarged. Overlying postoperative change is noted at the right side of the mediastinum. No acute osseous abnormalities are seen. Cervical spinal fusion hardware is noted. IMPRESSION: 1. Relatively  large left-sided pleural effusion. Small right pleural effusion. Underlying airspace opacity may reflect pulmonary edema or pneumonia. Underlying mass cannot be excluded. 2. Mild cardiomegaly. Electronically Signed   By: Roanna RaiderJeffery  Chang M.D.   On: 05/08/2016 21:55    ASSESSMENT / PLAN:   Large left sided pleural effusion, no significant respiratory failure, given the likelihood that this will quickly reaccumulate will not drain at this time. Patient also [facility] This was complicated by persistent air leak requiring placement of endobronchial valves.  Acute kidney injury, possibly developing hepatorenal syndrome.   GI bleed in the setting of End Stage Cirrhosis, status post EGD with findings of  bleeding esophageal varices, status post banding.  MSSA (methicillin susceptible Staphylococcus aureus) infection   Acute Blood Loss Anemia  Acute on Chronic Kidney disease   End Stage Cirrhosis  Abdominal Pain   Plan Advance diet as tolerated. Continue to monitor INR, vitamin K/FFP as needed. Transfusion of PRBC as needed. Overall poor prognosis, continue discussions with palliative care.   Wells Guiles-Deep Damarys Speir, M.D. 06/05/2016

## 2016-06-05 NOTE — Care Management (Signed)
I spoke with [facility] at [facility] and [facility] however Selena BattenKim states they never opened him for home health. [facility] per Selena BattenKim per [facility]. Per interdisciplinary rounds this AM palliative is discussing hospice services.

## 2016-06-05 NOTE — Progress Notes (Addendum)
Benjamin Bellows MD, MRCP(U.K) [address]  [place]  [place], [place] [address]  Main: Rogers is being followed for variceal bleed, cirrhosis, ascites  Day 2 of follow up   Subjective: Feels better, no hematemesis, 3 marron bowel movements    Objective: Vital signs in last 24 hours: Vitals:   06/05/16 0300 06/05/16 0400 06/05/16 0500 06/05/16 0600  BP: 108/63 99/64 101/68 100/66  Pulse: 83 83 82 86  Resp: _0 (!) 21  Temp:  98.5 F (36.9 C)    TempSrc:  Axillary    SpO2: 97% 95% 95% 100%  Weight:      Height:       Weight change:   Intake/Output Summary (Last 24 hours) at 06/05/16 0911 Last data filed at 06/05/16 0330  Gross per 24 hour  Intake            537.5 ml  Output              200 ml  Net            337.5 ml     Exam: Heart:: Regular rate and rhythm, S1S2 present or without murmur or extra heart sounds Lungs: normal, clear to auscultation and clear to auscultation and percussion Abdomen: distended , soft, nontender, normal bowel sounds   Lab Results: CBC Latest Ref Rng & Units 05/19/2016 06/05/2016 05/11/2016  WBC 3.8 - 10.6 K/uL - - 6.3  Hemoglobin 13.0 - 18.0 g/dL 7.5(L) 8.5(L) 7.4(L)  Hematocrit 40.0 - 52.0 % - 25.0(L) 21.9(L)  Platelets 150 - 440 K/uL - - 146(L)    Micro Results: Recent Results (from the past 240 hour(s))  MRSA PCR Screening     Status: None   Collection Time: 05/26/2016 12:10 AM  Result Value Ref Range Status   MRSA by PCR NEGATIVE NEGATIVE Final    Comment:        The GeneXpert MRSA Assay (FDA approved for NASAL specimens only), is one component of a comprehensive MRSA colonization surveillance program. It is not intended to diagnose MRSA infection nor to guide or monitor treatment for MRSA infections.    Studies/Results: Dg Chest 1 View  Result Date: 05/26/2016 CLINICAL DATA:  Acute onset of hemoptysis. Dark bloody stools. Acute onset of worsening generalized weakness and shortness  of breath with exertion. Initial encounter. EXAM: CHEST 1 VIEW COMPARISON:  None. FINDINGS: A relatively large left-sided pleural effusion is noted. A small right pleural effusion is seen. Underlying airspace opacity may reflect pulmonary edema or pneumonia. Underlying mass cannot be excluded. No pneumothorax is seen. The cardiomediastinal silhouette is mildly enlarged. Overlying postoperative change is noted at the right side of the mediastinum. No acute osseous abnormalities are seen. Cervical spinal fusion hardware is noted. IMPRESSION: 1. Relatively large left-sided pleural effusion. Small right pleural effusion. Underlying airspace opacity may reflect pulmonary edema or pneumonia. Underlying mass cannot be excluded. 2. Mild cardiomegaly. Electronically Signed   By: Garald Balding M.D.   On: 05/19/2016 21:55   Medications: I have reviewed the patient's current medications. Scheduled Meds: . insulin aspart  0-9 Units Subcutaneous Q4H  . loratadine  10 mg Oral Daily  . midodrine  15 mg Oral TID WC  . pantoprazole (PROTONIX) IV  40 mg Intravenous Q12H  . rifaximin  550 mg Oral BID  . sodium bicarbonate  1,300 mg Oral QID  . sodium chloride flush  3 mL Intravenous Q12H  . vitamin  E  400-800 Units Oral Daily   Continuous Infusions: . sodium chloride    . sodium chloride    . cefTRIAXone (ROCEPHIN) IVPB 2 gram/50 mL D5W (Pyxis) Stopped (05/14/2016 1758)  . octreotide  (SANDOSTATIN)    IV infusion 50 mcg/hr (06/05/16 0444)   PRN Meds:.sodium chloride, albuterol, bisacodyl, HYDROmorphone (DILAUDID) injection, ipratropium, magnesium citrate, meperidine, ondansetron **OR** ondansetron (ZOFRAN) IV, promethazine, senna-docusate, sodium chloride flush, zolpidem   Assessment: Active Problems:   Upper GI bleed  BMP Latest Ref Rng & Units 06/05/2016 05/25/2016 05/15/2016  Glucose 65 - 99 mg/dL 97 77 108(H)  BUN 6 - 20 mg/dL 58(H) 51(H) 52(H)  Creatinine 0.61 - 1.24 mg/dL 2.18(H) 1.97(H) 1.96(H)  Sodium  135 - 145 mmol/L 139 138 134(L)  Potassium 3.5 - 5.1 mmol/L 5.4(H) 4.9 4.8  Chloride 101 - 111 mmol/L 112(H) 109 109  CO2 22 - 32 mmol/L 21(L) 20(L) 20(L)  Calcium 8.9 - 10.3 mg/dL 8.4(L) 8.5(L) 8.3(L)   CBC    Component Value Date/Time   WBC 6.3 05/25/2016 0132   RBC 2.23 (L) 05/12/2016 0132   HGB 7.5 (L) 05/30/2016 1800   HCT 25.0 (L) 05/23/2016 0558   PLT 146 (L) 05/30/2016 0132   MCV 98.4 05/19/2016 0132   MCH 33.3 05/06/2016 0132   MCHC 33.8 05/15/2016 0132   RDW 21.6 (H) 06/01/2016 0132   LYMPHSABS 1.4 05/21/2016 1706   MONOABS 0.8 05/21/2016 1706   EOSABS 0.1 05/16/2016 1706   BASOSABS 0.0 05/27/2016 1706    Hepatic Function Latest Ref Rng & Units 06/05/2016 06/05/2016 10/13/2015  Total Protein 6.5 - 8.1 g/dL 5.6(L) 5.5(L) 6.7  Albumin 3.5 - 5.0 g/dL 3.3(L) 3.2(L) 3.1(L)  AST 15 - 41 U/L 31 30 51(H)  ALT 17 - 63 U/L 16(L) 14(L) 32  Alk Phosphatase 38 - 126 U/L 46 49 80  Total Bilirubin 0.3 - 1.2 mg/dL 1.6(H) 1.5(H) 2.3(H)     OSTIN MATHEY is a 56 y.o. 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 S/p placement of 9 bands across bleeding esophageal varices on 05/25/2016 with hemostasis achieved. No encephelopathy .MELD score of 20 , Childs Pugh score B-8 . He has decompensated cirrhosis . Only good long term option is a liver transplant- he says he is unable to maintain follow up appointments at [facility] and appears he lacks the social support that would be crucial for a liver transplant if he were to be a candidate.I had a long discussion with his wife and explained the options.She will discuss with Mr Mehra and will contact me with further questions . Discussed with Dr Juanell Fairly   Plan: 1. IV octreotide for a total of 72 hours , Antibiotics for SBP prophylaxis for a total of 5 days , monitor CBC and transfuse as needed, limit normal saline bolus if possible , can use albumin instead as the saline may worsen  his pleural effusions and increase pressure in the varices.   2.Complete three doses of vitamin K in total , 10 mg S.c each dose  4. Diagnostic and therapeutic abdominal paracentesis - send fluid to r/o SBP  5. As a long term plan will need a  hepatologist at [facility] and may very likely need liver transplant or TIPS with bridge to transplant.   6. Diet advance to clear liquids later today if shows no further signs of bleeding and Hb is stable later today . If he  does have a further variceal bleed , there would be no role for endoscopy and he would need a TIPS procedure at [facility] ( He prefers [facility]), did explain the risks of hepatic coma/encephelopathy and that is is only a bridge to liver transplant. He says no discussion of liver transplant occurred when he was at [facility] for 2 months.   7. Note increase of creatinine- may benefit from a few doses of albumin which would help if the etiology is pre renal . If not may need to pursue evaluation for hepatorenal syndrome.     LOS: 2 days   Benjamin Bradley 06/05/2016, 9:11 AM

## 2016-06-05 NOTE — Consult Note (Signed)
Consultation Note Date: 06/05/2016   Patient Name: Benjamin Bradley  DOB: Aug 10, 1960  MRN: [id number]  Age / Sex: 56 y.o., male  PCP: Patient, No Pcp Per Referring Physician: Theodoro Grist, MD  Reason for Consultation: Establishing goals of care  HPI/Patient Profile: 56 y.o. male  with past medical history of hepatitis C, cirrhosis, chronic kidney disease, diabetes mellitus, depression, degenerative disc disease, and umbilical hernia  admitted on 05/20/2016 with dark, tarry stools. Recent [facility] admission from 03/17/16-05/20/16 with pleural effusion, pneumothorax, cirrhosis secondary to hepatitis C, and pleural fluid infection. Discharged home after refusing SNF. This admit, upper GI bleed in the setting of end-stage cirrhosis. Received PRBC and FFP. EGD revealed bleeding esophageal varices s/p banding. Also with large left sided pleural effusion and acute on chronic kidney disease. Palliative medicine consultation for goals of care.          Clinical Assessment and Goals of Care: I have reviewed medical records, discussed with care team, and initially met with wife Benjamin Bradley) in family waiting room to discuss diagnoses, prognosis, GOC, EOL wishes, disposition and options. Patient drowsy and states "I just want to rest."   Introduced Palliative Medicine as specialized medical care for people living with serious illness. It focuses on providing relief from the symptoms and stress of a serious illness. The goal is to improve quality of life for both the patient and the family.  We discussed a brief life review of the patient. Married to wife for 25 years. They have 2 children. Wife shares stories of his history of ETOH abuse and hepatitis C. He drank heavily in his 20's and stopped for many years. After diagnosis of hep C and failed treatment in 2000, he started drinking again. She tells me he has NOT drank in 6 years. He  worked for [employer] but became disabled in 2014 from "back problems." He did not follow up with hepatologist or primary care because he did not have insurance.   Benjamin Bradley feels his decline started in November. "We thought the fluid buildup in his stomach was a hernia" therefore he never went to "get checked out." Benjamin Bradley tells of his stay at [facility] for two months and "not the same since."     Discussed in detail hospital diagnoses, interventions, and poor prognosis. Benjamin Bradley asks about a liver transplant. I explained my concern of him not living long enough to receive one, if even a candidate.   I attempted to elicit values and goals of care important to the patient. Being with his wife and children are most important. His faith is very important too.    Advanced directives and concepts specific to code status were discussed. Benjamin Bradley tells me he wanted "everything done in the ER" including resuscitation and life support. Educated on my medical recommendation for DNR/DNI with end-stage cirrhosis and this being more harm than good to him at the end of his life. Hard Choices copy given.   Briefly discussed hospice--Annie is concerned with taking him home and not being able to  care for him. She does not want to make decisions without him.   At the end of our conversation, we went to talk to Mr. Beaumier. I again explained hospital diagnoses and interventions. Educated on end-stage cirrhosis and guarded prognosis. Educated on medical recommendation for DNR/DNI. Mr. Rosiles is very quiet during the conversation. He finally states "am I not leaving the hospital?" Explained that he may leave the hospital but will likely need hospice services.    Answered questions and concerns. Provided emotional and spiritual support.    SUMMARY OF RECOMMENDATIONS    FULL code. Continue current interventions.   Educated on medical recommendation for DNR/DNI with end-stage cirrhosis.   Wife seems to understand diagnoses and  guarded prognosis. Patient processing our conversation.   PMT will f/u in AM to further discuss [place], code status, and hospice services.   Code Status/Advance Care Planning:  Full code  Symptom Management:   Per attending  Palliative Prophylaxis:   Aspiration, Delirium Protocol, Frequent Pain Assessment, Oral Care and Turn Reposition  Additional Recommendations (Limitations, Scope, Preferences):  Full Scope Treatment  Psycho-social/Spiritual:   Desire for further Chaplaincy support:yes  Additional Recommendations: Caregiving  Support/Resources, Compassionate Wean Education, Education on Hospice and Grief/Bereavement Support  Prognosis:  Unable to determine: guarded with end-stage cirrhosis, variceal bleed, acute renal failure, pleural effusions/ascites, and coagulopathy  Discharge Planning: To Be Determined eligible for hospice     Primary Diagnoses: Present on Admission: . Upper GI bleed   I have reviewed the medical record, interviewed the patient and family, and examined the patient. The following aspects are pertinent.  Past Medical History:  Diagnosis Date  . Arthritis    Neck and spine  . Chronic kidney disease 2007,2008, 1997   kidney stones  . Cirrhosis ([place])   . Degenerative disc disease, cervical   . Depression   . Diabetes mellitus 2009   type 2  . Headache(784.0)    magraines takes Demerol  . Hemorrhoid   . Hepatitis C 2000   Hep C  . 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  Marland Kitchen Umbilical hernia    Social History   Social History  . Marital status: Married    Spouse name: N/A  . Number of children: N/A  . Years of education: N/A   Social History Main Topics  . Smoking status: Former Smoker    Packs/day: 1.00    Years: 20.00    Types: Cigarettes    Quit date: 10/09/2007  . Smokeless tobacco: Never Used  . Alcohol use No  . Drug use: No  . Sexual activity: Not Asked   Other Topics Concern  . None   Social  History Narrative  . None   No family history on file. Scheduled Meds: . insulin aspart  0-9 Units Subcutaneous Q4H  . loratadine  10 mg Oral Daily  . midodrine  15 mg Oral TID WC  . pantoprazole (PROTONIX) IV  40 mg Intravenous Q12H  . rifaximin  550 mg Oral BID  . sodium bicarbonate  1,300 mg Oral QID  . sodium chloride flush  3 mL Intravenous Q12H  . vitamin E  400-800 Units Oral Daily   Continuous Infusions: . sodium chloride    . sodium chloride    . cefTRIAXone (ROCEPHIN) IVPB 2 gram/50 mL D5W (Pyxis) Stopped (05/23/2016 1758)  . octreotide  (SANDOSTATIN)    IV infusion 50 mcg/hr (06/05/16 0444)   PRN Meds:.sodium chloride, albuterol, bisacodyl, HYDROmorphone (  DILAUDID) injection, ipratropium, magnesium citrate, meperidine, ondansetron **OR** ondansetron (ZOFRAN) IV, promethazine, senna-docusate, sodium chloride flush, zolpidem Medications Prior to Admission:  Prior to Admission medications   Medication Sig Start Date End Date Taking? Authorizing Provider  cetirizine (ZYRTEC) 10 MG tablet Take 10 mg by mouth daily.   Yes [provider]  midodrine (PROAMATINE) 5 MG tablet Take 15 mg by mouth 3 (three) times daily with meals.   Yes [provider]  ondansetron (ZOFRAN-ODT) 4 MG disintegrating tablet Take 4 mg by mouth every 8 (eight) hours as needed for nausea or vomiting.   Yes [provider]  sodium bicarbonate 650 MG tablet Take 1,300 mg by mouth 4 (four) times daily.   Yes [provider]  ALPHA LIPOIC ACID PO Take 2 capsules by mouth 2 (two) times daily with a meal. 600 mg/ capsule    [provider]  CALCIUM-MAGNESIUM-ZINC PO Take 1 capsule by mouth daily.    [provider]  furosemide (LASIX) 20 MG tablet Take 1 tablet (20 mg total) by mouth daily. Patient not taking: Reported on 05/21/2016 10/14/15 10/13/16  Orbie Pyo, MD  GOLDEN SEAL ROOT PO Take 1-4 capsules by mouth See admin instructions. Takes 1 to 4  capsule for one week then off a week 550 mg/capsule    [provider]  meperidine (DEMEROL) 50 MG tablet Take 50-100 mg by mouth every 6 (six) hours as needed. For pain    [provider]  potassium chloride (KLOR-CON) 20 MEQ packet Take 20 mEq by mouth daily. Patient not taking: Reported on 05/12/2016 10/14/15   Orbie Pyo, MD  vitamin E 400 UNIT capsule Take 400-800 Units by mouth daily.    [provider]   Allergies  Allergen Reactions  . Nsaids Other (See Comments)    Cause severe hepatic reaction due to liver disease  . Vioxx [Rofecoxib] Other (See Comments)    Causes hepatic reaction due to liver disease  . Codeine Nausea And Vomiting   Review of Systems  Constitutional: Positive for activity change and appetite change.  Cardiovascular: Positive for leg swelling.  Gastrointestinal: Positive for blood in stool, diarrhea and nausea.  Neurological: Positive for weakness.   Physical Exam  Constitutional: He is oriented to person, place, and time. He appears cachectic. He is cooperative. He appears ill.  HENT:  Head: Normocephalic and atraumatic.  Cardiovascular: Regular rhythm and normal heart sounds.   Pulmonary/Chest: Effort normal. No accessory muscle usage. No tachypnea. No respiratory distress. He has decreased breath sounds.  Abdominal: Soft. Bowel sounds are normal.  Neurological: He is alert and oriented to person, place, and time.  Skin: Skin is warm and dry. Ecchymosis noted. There is pallor.  Psychiatric: His speech is delayed. He is withdrawn.  irritable  Nursing note and vitals reviewed.  Vital Signs: BP 97/64 (BP Location: Right Arm)   Pulse 78   Temp 98.5 F (36.9 C) (Axillary)   Resp (!) 21   Ht '5\' 11"'$  (1.803 m)   Wt 59.2 kg (130 lb 8.2 oz)   SpO2 100%   BMI 18.20 kg/m  Pain Assessment: No/denies pain   Pain Score: 0-No pain   SpO2: SpO2: 100 % O2 Device:SpO2: 100 % O2 Flow Rate: .O2 Flow Rate (L/min): 4  L/min  IO: Intake/output summary:   Intake/Output Summary (Last 24 hours) at 06/05/16 1136 Last data filed at 06/05/16 0330  Gross per 24 hour  Intake  487.5 ml  Output              200 ml  Net            287.5 ml    LBM: Last BM Date: 05/18/2016 Baseline Weight: Weight: 50.3 kg (111 lb) Most recent weight: Weight: 59.2 kg (130 lb 8.2 oz)     Palliative Assessment/Data: PPS 30%   Flowsheet Rows     Most Recent Value  Intake Tab  Referral Department  Critical care  Unit at Time of Referral  ICU  Palliative Care Primary Diagnosis  Other (Comment) [end-stage cirrhosis]  Date Notified  05/27/2016  Palliative Care Type  New Palliative care  Reason for referral  Clarify Goals of Care  Date of Admission  05/17/2016  Date first seen by Palliative Care  06/05/16  # of days IP prior to Palliative referral  1  Clinical Assessment  Palliative Performance Scale Score  30%  Psychosocial & Spiritual Assessment  Palliative Care Outcomes  Patient/Family meeting held?  Yes  Who was at the meeting?  patient and wife  Palliative Care Outcomes  Clarified goals of care, Counseled regarding hospice, ACP counseling assistance, Provided psychosocial or spiritual support, Provided end of life care assistance      Time In: 0900 Time Out: 1108 Time Total: 148mn Greater than 50%  of this time was spent counseling and coordinating care related to the above assessment and plan.  Signed by:  MIhor Dow FNP-C Palliative Medicine Team  Phone: [phone]: [phone]  Please contact Palliative Medicine Team phone at [phone] questions and concerns.  For individual provider: See AShea Evans

## 2016-06-05 NOTE — Progress Notes (Signed)
[facility] - [place] at [facility]   PATIENT NAME: Benjamin Bradley    MR#:  [id number]  DATE OF BIRTH:  07-17-1960  SUBJECTIVE:  CHIEF COMPLAINT:   Chief Complaint  Patient presents with  . Hemoptysis   The patient is 56 year old Caucasian male with medical history significant for history of liver cirrhosis, hep C, CAD, diabetes, depression, arthritis, arthritis, who presents to the hospital with complaints of hematemesis, tarry stool. On arrival to the hospital patient was noted to have hemoglobin level of 6.5. He was admitted for octreotide infusion, initiated on PPI intravenously. He underwent EGD by Dr. Tobi Bastos, revealing wheezing grade 3 esophageal varices, 9 bands were placed , bleeding has stopped. Patient is somnolent and not able to review of systems. Intermittently hypotensive with systolic blood pressure in 90s. Chest x-ray revealed left pleural effusion. Patient Feels poorly today, complains of achiness all over his body, continues to have maroon stool, but hemoglobin level remains stable/ gastroenterologist recommends paracentesis diagnostic.   Review of Systems  Constitutional: Negative for chills, fever and weight loss.  HENT: Negative for congestion.   Eyes: Negative for blurred vision and double vision.  Respiratory: Negative for cough, sputum production, shortness of breath and wheezing.   Cardiovascular: Negative for chest pain, palpitations, orthopnea, leg swelling and PND.  Gastrointestinal: Negative for abdominal pain, blood in stool, constipation, diarrhea, nausea and vomiting.  Genitourinary: Negative for dysuria, frequency, hematuria and urgency.  Musculoskeletal: Positive for myalgias. Negative for falls.  Neurological: Negative for dizziness, tremors, focal weakness and headaches.  Endo/Heme/Allergies: Does not bruise/bleed easily.  Psychiatric/Behavioral: Negative for depression. The patient does not have insomnia.     VITAL SIGNS:  Blood pressure 119/63, pulse 79, temperature 98.7 F (37.1 C), resp. rate (!) 31, height 5\' 11"  (1.803 m), weight 59.2 kg (130 lb 8.2 oz), SpO2 90 %.  PHYSICAL EXAMINATION:   GENERAL:  56 y.o.-year-old patient lying in the bed with no acute distress, much more alert today, communicating well Thin, wasted muscle mass EYES: Pupils equal, round, reactive to light and accommodation. No scleral icterus. Extraocular muscles intact.  HEENT: Head atraumatic, normocephalic. Oropharynx and nasopharynx clear.  NECK:  Supple, no jugular venous distention. No thyroid enlargement, no tenderness.  LUNGS: Diminished breath sounds on the left, some diminished on the right, however, able to hear breath sounds, no wheezing, rales,rhonchi or crepitation. Intermittent use of accessory muscles of respiration, decreased respiratory effort.  CARDIOVASCULAR: S1, S2 normal. No murmurs, rubs, or gallops.  ABDOMEN: Soft, diffusely tender but no rebound or guarding was noted, distended, ascitic fluid wave on percussion. Bowel sounds present. No organomegaly or mass.  EXTREMITIES:  1+ lower extremity and pedal edema, no cyanosis, or clubbing.  NEUROLOGIC: Cranial nerves II through XII are intact. Muscle strength 5/5 in all extremities. Sensation intact. Gait not checked.  PSYCHIATRIC: The patient is alert and oriented x 3.  SKIN: No obvious rash, lesion, or ulcer.   ORDERS/RESULTS REVIEWED:   CBC  Recent Labs Lab 06/02/16 1800 05/30/2016 1706 06/21/2016 0132 2016-06-21 0558 21-Jun-2016 1800 06/05/16 1424  WBC 8.8 6.8 6.3  --   --   --   HGB 9.2* 6.5* 7.4* 8.5* 7.5* 7.7*  HCT 26.3* 18.8* 21.9* 25.0*  --   --   PLT 201 163 146*  --   --   --   MCV 103.0* 105.0* 98.4  --   --   --   MCH 35.9* 36.2* 33.3  --   --   --  MCHC 34.9 34.5 33.8  --   --   --   RDW 16.6* 16.2* 21.6*  --   --   --   LYMPHSABS 1.7 1.4  --   --   --   --   MONOABS 0.9 0.8  --   --   --   --   EOSABS 0.1 0.1  --   --   --   --   BASOSABS 0.1 0.0   --   --   --   --    ------------------------------------------------------------------------------------------------------------------  Chemistries   Recent Labs Lab 06/02/16 1800 05/23/2016 1706 05/21/2016 0132 06/05/16 0313 06/05/16 1424  NA 131* 134* 138 139 137  K 4.4 4.8 4.9 5.4* 5.2*  CL 103 109 109 112* 108  CO2 20* 20* 20* 21* 19*  GLUCOSE 96 108* 77 97 86  BUN 43* 52* 51* 58* 58*  CREATININE 1.90* 1.96* 1.97* 2.18* 2.36*  CALCIUM 8.7* 8.3* 8.5* 8.4* 8.6*  AST  --  30  --  31  --   ALT  --  14*  --  16*  --   ALKPHOS  --  49  --  46  --   BILITOT  --  1.5*  --  1.6*  --    ------------------------------------------------------------------------------------------------------------------ estimated creatinine clearance is 29.3 mL/min (A) (by C-G formula based on SCr of 2.36 mg/dL (H)). ------------------------------------------------------------------------------------------------------------------ No results for input(s): TSH, T4TOTAL, T3FREE, THYROIDAB in the last 72 hours.  Invalid input(s): FREET3  Cardiac Enzymes  Recent Labs Lab 05/25/2016 0005 05/10/2016 0132 05/08/2016 1342  TROPONINI <0.03 <0.03 <0.03   ------------------------------------------------------------------------------------------------------------------ Invalid input(s): POCBNP ---------------------------------------------------------------------------------------------------------------  RADIOLOGY: Dg Chest 1 View  Result Date: 05/26/2016 CLINICAL DATA:  Acute onset of hemoptysis. Dark bloody stools. Acute onset of worsening generalized weakness and shortness of breath with exertion. Initial encounter. EXAM: CHEST 1 VIEW COMPARISON:  None. FINDINGS: A relatively large left-sided pleural effusion is noted. A small right pleural effusion is seen. Underlying airspace opacity may reflect pulmonary edema or pneumonia. Underlying mass cannot be excluded. No pneumothorax is seen. The cardiomediastinal  silhouette is mildly enlarged. Overlying postoperative change is noted at the right side of the mediastinum. No acute osseous abnormalities are seen. Cervical spinal fusion hardware is noted. IMPRESSION: 1. Relatively large left-sided pleural effusion. Small right pleural effusion. Underlying airspace opacity may reflect pulmonary edema or pneumonia. Underlying mass cannot be excluded. 2. Mild cardiomegaly. Electronically Signed   By: Roanna RaiderJeffery  Chang M.D.   On: 05/08/2016 21:55    EKG:  Orders placed or performed during the hospital encounter of 05/27/2016  . EKG 12-Lead    ASSESSMENT AND PLAN:  Active Problems:   Upper GI bleed   Acute GI bleeding   Cirrhosis of liver without ascites (HCC)   Coagulopathy (HCC)   Pleural effusion associated with hepatic disorder   Palliative care by specialist   Goals of care, counseling/discussion  #1. Acute Variceal bleeding, status post EGD ,  9 bands placed 05/08/2016 by Dr. Tobi BastosAnna, bleeding has stopped, continue octreotide, Protonix intravenously, follow clinically, continue patient nothing by mouth until gastroenterologist recommendations, following hemoglobin level closely #2. Acute posthemorrhagic anemia, status post 2 units of packed red blood cell transfusion, hemoglobin level is stable, transfuse patient as needed when the hemoglobin level is below 7 or active bleeding, discussed with Dr. Nicholos Johnsamachandran #3. Altered mental state, not hepatic encephalopathy, but rather due to sedating for procedure, as ammonia level was normal, hold Xifaxan #4. Hypotension, patient is  resumed on  midodrine , blood pressure remains low, but stable #5. Left pleural effusion, ascites, unable to use diuretics due to renal failure, hypotension, awaiting for palliative care input, discussed this patient's wife extensively yesterday. Patient and family had chance to discuss with Dr. Tobi Bastos, who felt that the patient needs to follow-up with [facility] hepatologist for further  recommendations, including TIPS procedure or liver transplant #6. Chronic renal failure, worsened today, likely due to hypotension, getting nephrologist involved  #7. Hyponatremia, resolved #8 . 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 Liver cirrhosis, likely end-stage, now with renal failure, gastroenterologist feels the patient is a candidate for liver transplant or TIPS procedure, patient will need to follow-up with [facility] hepatologist as outpatient, palliative care input is requested #10. Hyperkalemia, initiate patient on Kayexalate, follow potassium levels later today and in the morning, discussed with Dr. Nicholos Johns   Management plans discussed with the patient, family and they are in agreement.   DRUG ALLERGIES:  Allergies  Allergen Reactions  . Nsaids Other (See Comments)    Cause severe hepatic reaction due to liver disease  . Vioxx [Rofecoxib] Other (See Comments)    Causes hepatic reaction due to liver disease  . Codeine Nausea And Vomiting    CODE STATUS:     Code Status Orders        Start     Ordered   05/15/2016 0029  Full code  Continuous     05/24/2016 0028    Code Status History    Date Active Date Inactive Code Status Order ID Comments User Context   05/19/2016 12:17 AM 05/14/2016 12:17 AM Full Code [id number]  Tonye Royalty, DO Inpatient   05/26/2016 12:17 AM 05/27/2016 12:28 AM DNR [id number]  Tonye Royalty, DO Inpatient   05/31/2016  5:06 PM 06/03/2016  9:24 PM DNR [id number]  Sharman Cheek, MD ED      TOTAL TIME TAKING CARE OF THIS PATIENT: 40 minutes.    Katharina Caper M.D on 06/05/2016 at 5:03 PM  Between 7am to 6pm - Pager - [phone]  After 6pm go to [email or web address] - password [email or web address]  [facility]  Office  [phone]  CC: Primary care physician; Patient, No Pcp  Per

## 2016-06-05 NOTE — Progress Notes (Signed)
Rough day. Incontinent of stool and urine. Very unpleasant and refused 6 pm meds.

## 2016-06-05 NOTE — Progress Notes (Signed)
Report called to Gavin PoundDeborah for bed 144. Attempted to call wife, phone number disconnected.

## 2016-06-05 NOTE — Progress Notes (Signed)
PT Cancellation Note  Patient Details Name: Leticia ClasHarold D France MRN: [id number] DOB: January 27, 1960   Cancelled Treatment:    Reason Eval/Treat Not Completed: Other (comment). Consult received and chart reviewed. Per conversation with RN, pt very lethargic at this time. Vitals at 97/64 at this time. K+ at 5.4. Not appropriate for PT evaluation. Will re-attempt next date.   Aloise Copus 06/05/2016, 3:23 PM Elizabeth PalauStephanie Fowler Antos, PT, DPT [phone]

## 2016-06-05 NOTE — Progress Notes (Signed)
Initial Nutrition Assessment  DOCUMENTATION CODES:   Underweight  INTERVENTION:  1. Diet advancement per MD, monitor for tolerance 2. Discussed 2g Sodium diet moving forward. Wife was aware of this, seemed to be following at home, but when I mentioned this is only a tablespoon of salt she cursed as if she was unaware.  NUTRITION DIAGNOSIS:   Inadequate oral intake related to nausea, poor appetite, other (see comment) (taste changes) as evidenced by per patient/family report  GOAL:   Patient will meet greater than or equal to 90% of their needs  MONITOR:   PO intake, I & O's, Labs, Diet advancement, Weight trends, Supplement acceptance  REASON FOR ASSESSMENT:   Malnutrition Screening Tool    ASSESSMENT:   56 yo male admitted for endoscopy - GI Bleed, found w/ multiple esophageal varices, end stage cirrhosis, A-on-Chronic Kidney Disease, May need liver transplant  Spoke with Ms. Grudzien at bedside. Patient was sleeping. She reports patient eating well all throughout [facility] "eating whatever he could." States he has not been eating well since he came home - constantly complained of nausea, often only ate bites of meals. Normally would eat soft scrambled eggs in the morning and maybe toast, would eat bites of a cooked meal later that day - either for lunch or dinner. Has not eaten this since he left [facility]. Reports a normal weight of 150# until the beginning of this year when he started losing weight. Was trying to consume ensure but it hurt his stomach. Wife bought plant based protein powder and was mixing with milk for patient to try. Mentioned that it may have been milk, and patient would likely do better on clear liquid supplement, but patient does not like ensure clear - complains it's too sweet. +taste alterations Unable to complete Nutrition-Focused physical exam at this time.  Labs and medications reviewed: K 5.4, TBili 1.6 Vitamin E Ocetreotide gtt  Diet Order:  Diet  NPO time specified  Skin:  Reviewed, no issues  Last BM:  06/05/2016  Height:   Ht Readings from Last 1 Encounters:  01-13-2016 5\' 11"  (1.803 m)    Weight:   Wt Readings from Last 1 Encounters:  01-13-2016 130 lb 8.2 oz (59.2 kg)    Ideal Body Weight:  78.18 kg  BMI:  Body mass index is 18.2 kg/m.  Estimated Nutritional Needs:   Kcal:  [id number] calories  Protein:  77-88 grams  Fluid:  1.2L  EDUCATION NEEDS:   Education needs addressed  Dionne AnoWilliam M. Treon Kehl, MS, RD LDN Inpatient Clinical Dietitian Pager [phone]

## 2016-06-06 ENCOUNTER — Inpatient Hospital Stay: Payer: Medicare Other

## 2016-06-06 DIAGNOSIS — N179 Acute kidney failure, unspecified: Secondary | ICD-10-CM

## 2016-06-06 LAB — PROTIME-INR
INR: 1.33
Prothrombin Time: 16.6 seconds — ABNORMAL HIGH (ref 11.4–15.2)

## 2016-06-06 LAB — URINALYSIS, ROUTINE W REFLEX MICROSCOPIC
Bilirubin Urine: NEGATIVE
GLUCOSE, UA: NEGATIVE mg/dL
HGB URINE DIPSTICK: NEGATIVE
Ketones, ur: 5 mg/dL — AB
Leukocytes, UA: NEGATIVE
Nitrite: NEGATIVE
Protein, ur: NEGATIVE mg/dL
SPECIFIC GRAVITY, URINE: 1.013 (ref 1.005–1.030)
pH: 5 (ref 5.0–8.0)

## 2016-06-06 LAB — BASIC METABOLIC PANEL
Anion gap: 10 (ref 5–15)
BUN: 58 mg/dL — AB (ref 6–20)
CHLORIDE: 111 mmol/L (ref 101–111)
CO2: 18 mmol/L — AB (ref 22–32)
CREATININE: 2.4 mg/dL — AB (ref 0.61–1.24)
Calcium: 8.7 mg/dL — ABNORMAL LOW (ref 8.9–10.3)
GFR calc Af Amer: 33 mL/min — ABNORMAL LOW (ref >60)
GFR calc non Af Amer: 29 mL/min — ABNORMAL LOW (ref >60)
GLUCOSE: 91 mg/dL (ref 65–99)
Potassium: 4.8 mmol/L (ref 3.5–5.1)
Sodium: 139 mmol/L (ref 135–145)

## 2016-06-06 LAB — GLUCOSE, CAPILLARY
GLUCOSE-CAPILLARY: 124 mg/dL — AB (ref 65–99)
GLUCOSE-CAPILLARY: 169 mg/dL — AB (ref 65–99)
GLUCOSE-CAPILLARY: 78 mg/dL (ref 65–99)
Glucose-Capillary: 93 mg/dL (ref 65–99)
Glucose-Capillary: 96 mg/dL (ref 65–99)
Glucose-Capillary: 114 mg/dL — ABNORMAL HIGH (ref 65–99)

## 2016-06-06 LAB — CBC
HCT: 26.4 % — ABNORMAL LOW (ref 40.0–52.0)
Hemoglobin: 9.1 g/dL — ABNORMAL LOW (ref 13.0–18.0)
MCH: 32.1 pg (ref 26.0–34.0)
MCHC: 34.4 g/dL (ref 32.0–36.0)
MCV: 93.2 fL (ref 80.0–100.0)
PLATELETS: 148 10*3/uL — AB (ref 150–440)
RBC: 2.83 MIL/uL — ABNORMAL LOW (ref 4.40–5.90)
RDW: 22.3 % — AB (ref 11.5–14.5)
WBC: 10.5 10*3/uL (ref 3.8–10.6)

## 2016-06-06 LAB — PROTEIN / CREATININE RATIO, URINE
CREATININE, URINE: 74 mg/dL
Protein Creatinine Ratio: 0.49 mg/mg{Cre} — ABNORMAL HIGH (ref 0.00–0.15)
Total Protein, Urine: 36 mg/dL

## 2016-06-06 LAB — HEMOGLOBIN: Hemoglobin: 9.6 g/dL — ABNORMAL LOW (ref 13.0–18.0)

## 2016-06-06 MED ORDER — ALBUMIN HUMAN 25 % IV SOLN
25.0000 g | Freq: Four times a day (QID) | INTRAVENOUS | Status: DC
  Administered 2016-06-06 – 2016-06-08 (×8): 25 g via INTRAVENOUS
  Filled 2016-06-06 (×13): qty 100

## 2016-06-06 MED ORDER — SODIUM CHLORIDE 0.9 % IV SOLN
INTRAVENOUS | Status: DC
  Administered 2016-06-06: 12:00:00 via INTRAVENOUS

## 2016-06-06 MED ORDER — HYDROMORPHONE HCL 1 MG/ML IJ SOLN
0.5000 mg | INTRAMUSCULAR | Status: DC | PRN
  Administered 2016-06-06 (×2): 1 mg via INTRAVENOUS
  Filled 2016-06-06 (×3): qty 1

## 2016-06-06 NOTE — Evaluation (Signed)
Physical Therapy Evaluation Patient Details Name: Benjamin Bradley MRN: [id number] DOB: 07-Feb-1960 Today's Date: 06/06/2016   History of Present Illness  Pt admitted for upper GI bleed. Pt with complaints of hemotypsis. History includes cirrhosis, hep C, CKD, DM and depression. [facility]- 05/20/16. secondary to pleural effusion. Of note, EGD on May 13, 2016 and is s/p blood transfusion on 06/05/16. Pt currently NPO at this time.  Clinical Impression  Pt is a pleasant 56 year old male who was admitted for GI bleed. Pt frail looking in bed. Edema noted in B ankles. Pt performs bed mobility with min assist, transfers with cga, and ambulation with cga and RW. Required increased encouragement for participation with therapist. O2 removed for mobility, however sats decrease rapidly to 76% with limited exertion, O2 reapplied once seated in recliner with O2 sats improving to 86% on 2L of O2. Pt very persistent asking for ice chips secondary to dry mouth. Pt educated on NPO status. Poor historian and refuses to answer most history questions. Pt demonstrates deficits with strength/mobility/endurance. Would benefit from skilled PT to address above deficits and promote optimal return to PLOF. Recommend transition to HHPT upon discharge from acute hospitalization. Will continue to progress.       Follow Up Recommendations Home health PT;Supervision/Assistance - 24 hour    Equipment Recommendations  Rolling walker with 5" wheels    Recommendations for Other Services       Precautions / Restrictions Precautions Precautions: Fall Restrictions Weight Bearing Restrictions: No      Mobility  Bed Mobility Overal bed mobility: Needs Assistance Bed Mobility: Supine to Sit     Supine to sit: Min assist     General bed mobility comments: assist for trunk support during transfer. Pt able to initiate movement, and demonstrates good balance once seated at  EOB.  Transfers Overall transfer level: Needs assistance Equipment used: Rolling walker (2 wheeled) Transfers: Sit to/from Stand Sit to Stand: Min guard         General transfer comment: safe technique performed with upright posture. RW used for assistance  Ambulation/Gait Ambulation/Gait assistance: Min guard Ambulation Distance (Feet): 3 Feet Assistive device: Rolling walker (2 wheeled) Gait Pattern/deviations: Step-to pattern     General Gait Details: ambulated to recliner with RW. Pt on room air for ambulation with sats decreasing to 76%. Pt with increased SOB symptoms and 2L of O2 reapplied on pt once seated with sats improving to 85%. Further ambulation deferred at this time  Stairs            Wheelchair Mobility    Modified Rankin (Stroke Patients Only)       Balance Overall balance assessment: Needs assistance Sitting-balance support: Feet supported Sitting balance-Leahy Scale: Good     Standing balance support: Bilateral upper extremity supported Standing balance-Leahy Scale: Good                               Pertinent Vitals/Pain Pain Assessment: No/denies pain    Home Living Family/patient expects to be discharged to:: Private residence Living Arrangements: Spouse/significant other               Additional Comments: pt is poor historian and generally does not answer questions when asked. Pt very focused on wanting water, despite being NPO. Will continue to assess    Prior Function           Comments: Pt reports  he ambulated at home, however vague as far as amount and if he used AD. Will continue to assess.     Hand Dominance        Extremity/Trunk Assessment   Upper Extremity Assessment Upper Extremity Assessment: Generalized weakness (B UE grossly 3+/5)    Lower Extremity Assessment Lower Extremity Assessment: Generalized weakness (B LE grossly 3+/5)       Communication   Communication: No difficulties   Cognition Arousal/Alertness: Awake/alert Behavior During Therapy: WFL for tasks assessed/performed Overall Cognitive Status: Within Functional Limits for tasks assessed                                        General Comments      Exercises Other Exercises Other Exercises: supine/seated ther-ex performed including ankle pumps, SLRs, and LAQ. All ther-ex performed x 10 reps on B LE with cga and cues for correct technique   Assessment/Plan    PT Assessment Patient needs continued PT services  PT Problem List Decreased strength;Decreased activity tolerance;Decreased balance;Decreased mobility;Decreased cognition       PT Treatment Interventions Gait training;DME instruction;Therapeutic exercise;Balance training    PT Goals (Current goals can be found in the Care Plan section)  Acute Rehab PT Goals Patient Stated Goal: to drink water PT Goal Formulation: With patient Time For Goal Achievement: 06/20/16 Potential to Achieve Goals: Good    Frequency Min 2X/week   Barriers to discharge        Co-evaluation               AM-PAC PT "6 Clicks" Daily Activity  Outcome Measure Difficulty turning over in bed (including adjusting bedclothes, sheets and blankets)?: Total Difficulty moving from lying on back to sitting on the side of the bed? : Total Difficulty sitting down on and standing up from a chair with arms (e.g., wheelchair, bedside commode, etc,.)?: Total Help needed moving to and from a bed to chair (including a wheelchair)?: A Little Help needed walking in hospital room?: A Lot Help needed climbing 3-5 steps with a railing? : A Lot 6 Click Score: 10    End of Session Equipment Utilized During Treatment: Gait belt;Oxygen Activity Tolerance: Patient limited by fatigue Patient left: in chair;with chair alarm set Nurse Communication: Mobility status PT Visit Diagnosis: Unsteadiness on feet (R26.81);Muscle weakness (generalized) (M62.81)    Time:  1610-9604 PT Time Calculation (min) (ACUTE ONLY): 20 min   Charges:   PT Evaluation $PT Eval Low Complexity: 1 Procedure PT Treatments $Therapeutic Exercise: 8-22 mins   PT G Codes:        Elizabeth Palau, PT, DPT [phone]   Benjamin Bradley 06/06/2016, 10:04 AM

## 2016-06-06 NOTE — Progress Notes (Signed)
Daily Progress Note   Patient Name: Benjamin Bradley       Date: 06/06/2016 DOB: May 25, 1960  Age: 56 y.o. MRN#: [id number] Attending Physician: Katharina Caper, MD Primary Care Physician: Patient, No Pcp Per Admit Date: 05/17/2016  Reason for Consultation/Follow-up: Establishing goals of care  Subjective: Patient awake and alert. C/o of generalized pain. Notified RN to give prn dilaudid. He has tolerated clear liquids today and worked with physical therapy. Requesting to be transferred to [facility] for further evaluation regarding liver transplant.   Wife was not at bedside during my visit. Provided emotional support to patient.   Length of Stay: 3  Current Medications: Scheduled Meds:  . insulin aspart  0-9 Units Subcutaneous Q4H  . loratadine  10 mg Oral Daily  . midodrine  15 mg Oral TID WC  . pantoprazole (PROTONIX) IV  40 mg Intravenous Q12H  . phytonadione  10 mg Subcutaneous Daily  . sodium bicarbonate  1,300 mg Oral QID  . sodium chloride flush  3 mL Intravenous Q12H  . vitamin E  400-800 Units Oral Daily    Continuous Infusions: . sodium chloride    . sodium chloride    . sodium chloride    . albumin human    . cefTRIAXone (ROCEPHIN) IVPB 2 gram/50 mL D5W (Pyxis) 2 g (06/05/16 1807)  . octreotide  (SANDOSTATIN)    IV infusion 50 mcg/hr (06/06/16 0620)    PRN Meds: sodium chloride, albuterol, bisacodyl, HYDROmorphone (DILAUDID) injection, ipratropium, magnesium citrate, meperidine, ondansetron **OR** ondansetron (ZOFRAN) IV, phenol, promethazine, senna-docusate, sodium chloride flush, zolpidem  Physical Exam  Constitutional: He is cooperative. He appears ill.  HENT:  Head: Normocephalic and atraumatic.  Cardiovascular: Regular rhythm.   Pulmonary/Chest: Effort normal. No  tachypnea. No respiratory distress.  Abdominal: Soft.  Neurological: He is alert.  Skin: Skin is warm and dry.  Psychiatric: His speech is normal. He is withdrawn.  irritable  Nursing note and vitals reviewed.          Vital Signs: BP 126/76 (BP Location: Left Arm)   Pulse 92   Temp 98.6 F (37 C) (Oral)   Resp 14   Ht 5\' 11"  (1.803 m)   Wt 59.2 kg (130 lb 8.2 oz)   SpO2 92%   BMI 18.20 kg/m  SpO2: SpO2: 92 % O2  Device: O2 Device: Nasal Cannula O2 Flow Rate: O2 Flow Rate (L/min): 2 L/min  Intake/output summary:   Intake/Output Summary (Last 24 hours) at 06/06/16 1730 Last data filed at 06/06/16 1500  Gross per 24 hour  Intake           646.08 ml  Output              800 ml  Net          -153.92 ml   LBM: Last BM Date: 06/06/16 Baseline Weight: Weight: 50.3 kg (111 lb) Most recent weight: Weight: 59.2 kg (130 lb 8.2 oz)  Palliative Assessment/Data: PPS 40%   Flowsheet Rows     Most Recent Value  Intake Tab  Referral Department  Critical care  Unit at Time of Referral  ICU  Palliative Care Primary Diagnosis  Other (Comment) [end-stage cirrhosis]  Date Notified  05/27/2016  Palliative Care Type  New Palliative care  Reason for referral  Clarify Goals of Care  Date of Admission  10-26-2016  Date first seen by Palliative Care  06/05/16  # of days IP prior to Palliative referral  1  Clinical Assessment  Palliative Performance Scale Score  40%  Psychosocial & Spiritual Assessment  Palliative Care Outcomes  Patient/Family meeting held?  Yes  Who was at the meeting?  patient and wife  Palliative Care Outcomes  Clarified goals of care, ACP counseling assistance, Provided end of life care assistance, Provided psychosocial or spiritual support, Counseled regarding hospice, Linked to palliative care logitudinal support      Patient Active Problem List   Diagnosis Date Noted  . Acute GI bleeding   . Cirrhosis of liver without ascites (HCC)   . Coagulopathy (HCC)   .  Pleural effusion associated with hepatic disorder   . Palliative care by specialist   . Goals of care, counseling/discussion   . Upper GI bleed 2016-09-30    Palliative Care Assessment & Plan   Patient Profile: 56 y.o. male  with past medical history of hepatitis C, cirrhosis, chronic kidney disease, diabetes mellitus, depression, degenerative disc disease, and umbilical hernia  admitted on 05/16/2016 with dark, tarry stools. Recent [facility] admission from 03/17/16-05/20/16 with pleural effusion, pneumothorax, cirrhosis secondary to hepatitis C, and pleural fluid infection. Discharged home after refusing SNF. This admit, upper GI bleed in the setting of end-stage cirrhosis. Received PRBC and FFP. EGD revealed bleeding esophageal varices s/p banding. Also with large left sided pleural effusion and acute on chronic kidney disease. Palliative medicine consultation for goals of care.   Assessment: End-stage liver cirrhosis Acute variceal bleeding s/p EGD with 9 bands Anemia Hypotension Left pleural effusion Ascites Renal failure Hyponatremia Coagulopathy Hyperkalemia  Recommendations/Plan:  FULL code/FULL scope treatment. Patient/wife [facility] Bed pending.   Increased dilaudid 0.5-1mg  q3h prn pain. Patient c/o generalized pain.   [facility] but will f/u next week if still hospitalized.   Goals of Care and Additional Recommendations:  Limitations on Scope of Treatment: Full Scope Treatment  Code Status: FULL   Code Status Orders        Start     Ordered   05/12/2016 0029  Full code  Continuous     05/16/2016 0028    Code Status History    Date Active Date Inactive Code Status Order ID Comments User Context   05/29/2016 12:17 AM 05/23/2016 12:17 AM Full Code [id number]  Hugelmeyer, Jon GillsAlexis, DO Inpatient   05/21/2016 12:17 AM  06-26-2016 12:28 AM DNR [id number]  Tonye Royalty, DO Inpatient   06/02/2016  5:06 PM 05/11/2016  9:24 PM DNR [id number]   Sharman Cheek, MD ED       Prognosis:   Unable to determine  Discharge Planning:  To Be Determined  Care plan was discussed with patient, Dr. Winona Legato, and RN  Thank you for allowing the Palliative Medicine Team to assist in the care of this patient.   Time In: 1615 Time Out: 1640 Total Time Prolonged Time Billed  no      Greater than 50%  of this time was spent counseling and coordinating care related to the above assessment and plan.  Vennie Homans, FNP-C Palliative Medicine Team  Phone: [phone] Fax: [phone]  Please contact Palliative Medicine Team phone at [phone] for questions and concerns.

## 2016-06-06 NOTE — Progress Notes (Signed)
Patient updated on [facility] period [facility] per Dr Winona LegatoVaickute. Patient understands and accepts at this time.   Suzan SlickAlison L Jesusa Stenerson, RN

## 2016-06-06 NOTE — Progress Notes (Signed)
[facility] - [place] at [facility]   PATIENT NAME: Benjamin Bradley    MR#:  [id number]  DATE OF BIRTH:  04/14/1960  SUBJECTIVE:  CHIEF COMPLAINT:   Chief Complaint  Patient presents with  . Hemoptysis   The patient is 56 year old Caucasian male with medical history significant for history of liver cirrhosis, hep C, CAD, diabetes, depression, arthritis, arthritis, who presents to the hospital with complaints of hematemesis, tarry stool. On arrival to the hospital patient was noted to have hemoglobin level of 6.5. He was admitted for octreotide infusion, initiated on PPI intravenously. He underwent EGD by Dr. Tobi Bastos, revealing wheezing grade 3 esophageal varices, 9 bands were placed , bleeding has stopped. The patient was noted to have hemoglobin drop to 6.6 yesterday, transfuse 1 more unit of packed red blood cells after which hemoglobin level improved to 9.1. Patient's blood pressure also improved after transfusion. Patient was to be transferred to [facility], attempt to transfer the patient to [facility] was entertained, discussed with the pathologist Dr. Piedad Climes and hospitalist Dr. Mayford Knife of [facility], transfer was accepted, however, could be delayed due to prolonged waiting period .,    Review of Systems  Constitutional: Negative for chills, fever and weight loss.  HENT: Negative for congestion.   Eyes: Negative for blurred vision and double vision.  Respiratory: Negative for cough, sputum production, shortness of breath and wheezing.   Cardiovascular: Negative for chest pain, palpitations, orthopnea, leg swelling and PND.  Gastrointestinal: Negative for abdominal pain, blood in stool, constipation, diarrhea, nausea and vomiting.  Genitourinary: Negative for dysuria, frequency, hematuria and urgency.  Musculoskeletal: Positive for myalgias. Negative for falls.  Neurological: Negative for dizziness, tremors, focal weakness and headaches.  Endo/Heme/Allergies: Does not  bruise/bleed easily.  Psychiatric/Behavioral: Negative for depression. The patient does not have insomnia.     VITAL SIGNS: Blood pressure 126/76, pulse 92, temperature 98.6 F (37 C), temperature source Oral, resp. rate 14, height 5\' 11"  (1.803 m), weight 59.2 kg (130 lb 8.2 oz), SpO2 92 %.  PHYSICAL EXAMINATION:   GENERAL:  56 y.o.-year-old patient lying in the bed with no acute distress, alert , , however, uncomfortable, somewhat withdrawn. Thin, wasted muscle mass EYES: Pupils equal, round, reactive to light and accommodation. No scleral icterus. Extraocular muscles intact.  HEENT: Head atraumatic, normocephalic. Oropharynx and nasopharynx clear.  NECK:  Supple, no jugular venous distention. No thyroid enlargement, no tenderness.  LUNGS: Diminished breath sounds on the left, some diminished on the right, however, able to hear breath sounds, no wheezing, rales,rhonchi or crepitation. Intermittent use of accessory muscles of respiration, decreased respiratory effort.  CARDIOVASCULAR: S1, S2 normal. No murmurs, rubs, or gallops.  ABDOMEN: Sof, No significant tenderness, no rebound or guarding was noted, some distended, ascitic fluid wave on percussion. Bowel sounds present. No organomegaly or mass.  EXTREMITIES:  1+ lower extremity and pedal edema, no cyanosis, or clubbing.  NEUROLOGIC: Cranial nerves II through XII are intact. Muscle strength 5/5 in all extremities. Sensation intact. Gait not checked.  PSYCHIATRIC: The patient is alert and oriented x 3.  SKIN: No obvious rash, lesion, or ulcer.   ORDERS/RESULTS REVIEWED:   CBC  Recent Labs Lab 06/02/16 1800 06/01/2016 1706 06/17/2016 0132 June 17, 2016 0558 2016/06/17 1800 06/05/16 1424 06/05/16 2054 06/06/16 0452 06/06/16 1258  WBC 8.8 6.8 6.3  --   --   --   --  10.5  --   HGB 9.2* 6.5* 7.4* 8.5* 7.5* 7.7* 6.6* 9.1* 9.6*  HCT  26.3* 18.8* 21.9* 25.0*  --   --   --  26.4*  --   PLT 201 163 146*  --   --   --   --  148*  --   MCV 103.0*  105.0* 98.4  --   --   --   --  93.2  --   MCH 35.9* 36.2* 33.3  --   --   --   --  32.1  --   MCHC 34.9 34.5 33.8  --   --   --   --  34.4  --   RDW 16.6* 16.2* 21.6*  --   --   --   --  22.3*  --   LYMPHSABS 1.7 1.4  --   --   --   --   --   --   --   MONOABS 0.9 0.8  --   --   --   --   --   --   --   EOSABS 0.1 0.1  --   --   --   --   --   --   --   BASOSABS 0.1 0.0  --   --   --   --   --   --   --    ------------------------------------------------------------------------------------------------------------------  Chemistries   Recent Labs Lab 05/29/2016 1706 05/15/2016 0132 06/05/16 0313 06/05/16 1424 06/06/16 0452  NA 134* 138 139 137 139  K 4.8 4.9 5.4* 5.2* 4.8  CL 109 109 112* 108 111  CO2 20* 20* 21* 19* 18*  GLUCOSE 108* 77 97 86 91  BUN 52* 51* 58* 58* 58*  CREATININE 1.96* 1.97* 2.18* 2.36* 2.40*  CALCIUM 8.3* 8.5* 8.4* 8.6* 8.7*  AST 30  --  31  --   --   ALT 14*  --  16*  --   --   ALKPHOS 49  --  46  --   --   BILITOT 1.5*  --  1.6*  --   --    ------------------------------------------------------------------------------------------------------------------ estimated creatinine clearance is 28.8 mL/min (A) (by C-G formula based on SCr of 2.4 mg/dL (H)). ------------------------------------------------------------------------------------------------------------------ No results for input(s): TSH, T4TOTAL, T3FREE, THYROIDAB in the last 72 hours.  Invalid input(s): FREET3  Cardiac Enzymes  Recent Labs Lab 05/13/2016 0005 05/10/2016 0132 05/30/2016 1342  TROPONINI <0.03 <0.03 <0.03   ------------------------------------------------------------------------------------------------------------------ Invalid input(s): POCBNP ---------------------------------------------------------------------------------------------------------------  RADIOLOGY: No results found.  EKG:  Orders placed or performed during the hospital encounter of 05/17/2016  . EKG 12-Lead     ASSESSMENT AND PLAN:  Active Problems:   Upper GI bleed   Acute GI bleeding   Cirrhosis of liver without ascites (HCC)   Coagulopathy (HCC)   Pleural effusion associated with hepatic disorder   Palliative care by specialist   Goals of care, counseling/discussion  #1. Acute Variceal bleeding, status post EGD ,  9 bands placed 05/18/2016 by Dr. Tobi Bastos, bleeding has stopped, continue octreotide, Protonix intravenously,  stabilized clinically, . Initiate clear liquid diet, discussed with Dr. Tobi Bastos  #2. Acute posthemorrhagic anemia, status post 3 units of packed red blood cell transfusion, hemoglobin level has improved, no more bleeding noted,  transfuse patient as needed when the hemoglobin level is below 7 or active bleeding, discussed with Dr. Tobi Bastos today  #3. Altered mental state, not hepatic encephalopathy, but rather due to sedating for procedure, ammonia level was normal, holding Xifaxan #4. Hypotension, continue midodrine, blood pressure has improved after transfusion and with  medication #5. Left pleural effusion, ascites, unable to use diuretics due to renal failure,   relative hypotension, awaiting for palliative care input, discussed this patient's wife , also hepatologist, [facility]. [facility]arling [facility], who recommended for patient to follow-up with hepatologist as outpatient to determine his eligibility for liver transplant or TIPS procedure  #6.  acute on chronic renal failure, worsened , suspected  due to hypotension,. Awaiting for nephrologist input, discussed with Dr. Thedore MinsSingh, getting renal ultrasound, urinalysis, urine protein creatinine ratio  #7. Hyponatremia, resolved with IV fluids, transfusions  #8 . Coagulopathy due to liver cirrhosis, continue subcutaneous vitamin K administrations 3 days, patient received fresh frozen plasma transfusion on admission, INR has improved,  remains stable #9 Liver cirrhosis,The patient is to [facility] as outpatient to determine if he is  a  candidate for liver transplant or TIPS procedure, , palliative care input is requested, pending  #10. Hyperkalemia, the patient received Kayexalate, potassium level improved, follow creatinine and potassium in the morning   #11. Ascites with suspected continued bacterial peritonitis, patient is to continue Rocephin, 5 day therapy, discussed with Dr. Tobi BastosAnna    Management plans discussed with the patient, family and they are in agreement.   DRUG ALLERGIES:  Allergies  Allergen Reactions  . Nsaids Other (See Comments)    Cause severe hepatic reaction due to liver disease  . Vioxx [Rofecoxib] Other (See Comments)    Causes hepatic reaction due to liver disease  . Codeine Nausea And Vomiting    CODE STATUS:     Code Status Orders        Start     Ordered   Jul 02, 2016 0029  Full code  Continuous     Jul 02, 2016 0028    Code Status History    Date Active Date Inactive Code Status Order ID Comments User Context   11-07-2016 12:17 AM 11-07-2016 12:17 AM Full Code [id number]  Tonye RoyaltyHugelmeyer, Alexis, DO Inpatient   11-07-2016 12:17 AM 11-07-2016 12:28 AM DNR [id number]  Tonye RoyaltyHugelmeyer, Alexis, DO Inpatient   05/11/2016  5:06 PM 05/31/2016  9:24 PM DNR [id number]  Sharman CheekStafford, Phillip, MD ED      TOTAL TIME TAKING CARE OF THIS PATIENT: 75 minutes.   patient's case was extensively discussed with [facility] physicians including Dr. Mayford KnifeWilliams 2., Dr. Piedad Climesarling, transfer center, [facility], transfer is pending   Jessicia Napolitano M.D on 06/06/2016 at 3:45 PM  Between 7am to 6pm - Pager - [phone]  After 6pm go to [email or web address] - password [email or web address]  Fabio Neighborsagle [place] [facility]  Office  [phone]  CC: Primary care physician; Patient, No Pcp Per

## 2016-06-06 NOTE — Progress Notes (Signed)
Patient continues to have a small amount of black stools but no further sign of acute GI bleeding. The nurse reports that the patient has refused medications. The patient's hemoglobin is stable. As recommended and Dr. Sherene SiresAnne's notes if the patient has any further bleeding he may need to be sent out for a TIPS procedure

## 2016-06-06 NOTE — Care Management Important Message (Signed)
Important Message  Patient Details  Name: Benjamin Bradley MRN: [id number] Date of Birth: [birth year]   Medicare Important Message Given:  Yes    Marily MemosLisa M Deshana Rominger, RN 06/06/2016, 9:15 AM

## 2016-06-06 NOTE — Progress Notes (Signed)
Patient is off the unit for renal u/s.

## 2016-06-06 NOTE — Progress Notes (Signed)
Pharmacy Antibiotic Note  Benjamin Bradley is a 56 y.o. male with a h/o end-stage cirrhosis admitted on 05/13/2016 with GIB.  Pharmacy has been consulted for ceftriaxone dosing for SBP prophylaxis.  Plan: Day 3 of Abx. Continue ceftriaxone 2 grams q 24 hours.  Plan is a total of 5 days of therapy per GI. Peritoneal fluid sent for analysis.   Height: 5\' 11"  (180.3 cm) Weight: 130 lb 8.2 oz (59.2 kg) IBW/kg (Calculated) : 75.3  Temp (24hrs), Avg:98.6 F (37 C), Min:98.3 F (36.8 C), Max:98.9 F (37.2 C)   Recent Labs Lab 06/02/16 1800 05/06/2016 1706 06/03/2016 0132 06/05/16 0313 06/05/16 1424 06/06/16 0452  WBC 8.8 6.8 6.3  --   --  10.5  CREATININE 1.90* 1.96* 1.97* 2.18* 2.36* 2.40*    Estimated Creatinine Clearance: 28.8 mL/min (A) (by C-G formula based on SCr of 2.4 mg/dL (H)).    Allergies  Allergen Reactions  . Nsaids Other (See Comments)    Cause severe hepatic reaction due to liver disease  . Vioxx [Rofecoxib] Other (See Comments)    Causes hepatic reaction due to liver disease  . Codeine Nausea And Vomiting    Antimicrobials this admission: ceftriaxone 5/29 >>   Dose adjustments this admission:   Microbiology results: 5/30 MRSA PCR: negative  Thank you for allowing pharmacy to be a part of this patient's care.  Sahand Gosch M Laurian Edrington 06/06/2016 11:40 AM

## 2016-06-06 DEATH — deceased

## 2016-06-07 ENCOUNTER — Inpatient Hospital Stay: Payer: Medicare Other

## 2016-06-07 DIAGNOSIS — J918 Pleural effusion in other conditions classified elsewhere: Secondary | ICD-10-CM

## 2016-06-07 DIAGNOSIS — N179 Acute kidney failure, unspecified: Secondary | ICD-10-CM

## 2016-06-07 DIAGNOSIS — Z9889 Other specified postprocedural states: Secondary | ICD-10-CM

## 2016-06-07 DIAGNOSIS — K769 Liver disease, unspecified: Secondary | ICD-10-CM

## 2016-06-07 DIAGNOSIS — N170 Acute kidney failure with tubular necrosis: Secondary | ICD-10-CM

## 2016-06-07 LAB — BPAM RBC
BLOOD PRODUCT EXPIRATION DATE: 201806052359
Blood Product Expiration Date: 201806042359
Blood Product Expiration Date: 201806202359
ISSUE DATE / TIME: 201805291924
ISSUE DATE / TIME: 201805312322
ISSUE DATE / TIME: 201805300240
UNIT TYPE AND RH: 5100
Unit Type and Rh: 5100
Unit Type and Rh: 5100

## 2016-06-07 LAB — PHOSPHORUS: PHOSPHORUS: 5.3 mg/dL — AB (ref 2.5–4.6)

## 2016-06-07 LAB — TYPE AND SCREEN
ABO/RH(D): O POS
Antibody Screen: NEGATIVE
UNIT DIVISION: 0
Unit division: 0
Unit division: 0

## 2016-06-07 LAB — BASIC METABOLIC PANEL
Anion gap: 12 (ref 5–15)
BUN: 57 mg/dL — AB (ref 6–20)
CALCIUM: 9.2 mg/dL (ref 8.9–10.3)
CHLORIDE: 113 mmol/L — AB (ref 101–111)
CO2: 17 mmol/L — AB (ref 22–32)
CREATININE: 2.44 mg/dL — AB (ref 0.61–1.24)
GFR calc non Af Amer: 28 mL/min — ABNORMAL LOW (ref >60)
GFR, EST AFRICAN AMERICAN: 32 mL/min — AB (ref >60)
Glucose, Bld: 170 mg/dL — ABNORMAL HIGH (ref 65–99)
Potassium: 5.1 mmol/L (ref 3.5–5.1)
Sodium: 142 mmol/L (ref 135–145)

## 2016-06-07 LAB — PROTIME-INR
INR: 1.57
INR: 1.74
PROTHROMBIN TIME: 18.9 s — AB (ref 11.4–15.2)
Prothrombin Time: 20.6 seconds — ABNORMAL HIGH (ref 11.4–15.2)

## 2016-06-07 LAB — BLOOD GAS, ARTERIAL
ACID-BASE DEFICIT: 10.9 mmol/L — AB (ref 0.0–2.0)
Acid-base deficit: 8.2 mmol/L — ABNORMAL HIGH (ref 0.0–2.0)
Bicarbonate: 16.8 mmol/L — ABNORMAL LOW (ref 20.0–28.0)
Bicarbonate: 18.8 mmol/L — ABNORMAL LOW (ref 20.0–28.0)
FIO2: 1
FIO2: 0.65
MECHANICAL RATE: 16
MECHVT: 450 mL
Mechanical Rate: 16
O2 SAT: 92.1 %
O2 Saturation: 89.4 %
PEEP/CPAP: 10 cmH2O
PEEP/CPAP: 10 cmH2O
PH ART: 7.18 — AB (ref 7.350–7.450)
PH ART: 7.23 — AB (ref 7.350–7.450)
Patient temperature: 37
Patient temperature: 37
VT: 450 mL
pCO2 arterial: 45 mmHg (ref 32.0–48.0)
pCO2 arterial: 45 mmHg (ref 32.0–48.0)
pO2, Arterial: 72 mmHg — ABNORMAL LOW (ref 83.0–108.0)
pO2, Arterial: 76 mmHg — ABNORMAL LOW (ref 83.0–108.0)

## 2016-06-07 LAB — LACTIC ACID, PLASMA: LACTIC ACID, VENOUS: 1.9 mmol/L (ref 0.5–1.9)

## 2016-06-07 LAB — GLUCOSE, CAPILLARY
GLUCOSE-CAPILLARY: 127 mg/dL — AB (ref 65–99)
GLUCOSE-CAPILLARY: 149 mg/dL — AB (ref 65–99)
GLUCOSE-CAPILLARY: 92 mg/dL (ref 65–99)
Glucose-Capillary: 182 mg/dL — ABNORMAL HIGH (ref 65–99)
Glucose-Capillary: 94 mg/dL (ref 65–99)

## 2016-06-07 LAB — CBC
HCT: 23.2 % — ABNORMAL LOW (ref 40.0–52.0)
Hemoglobin: 7.7 g/dL — ABNORMAL LOW (ref 13.0–18.0)
MCH: 31.9 pg (ref 26.0–34.0)
MCHC: 33.1 g/dL (ref 32.0–36.0)
MCV: 96.4 fL (ref 80.0–100.0)
PLATELETS: 175 10*3/uL (ref 150–440)
RBC: 2.41 MIL/uL — AB (ref 4.40–5.90)
RDW: 21.5 % — ABNORMAL HIGH (ref 11.5–14.5)
WBC: 11.8 10*3/uL — ABNORMAL HIGH (ref 3.8–10.6)

## 2016-06-07 LAB — HEMOGLOBIN
HEMOGLOBIN: 5.6 g/dL — AB (ref 13.0–18.0)
Hemoglobin: 9.6 g/dL — ABNORMAL LOW (ref 13.0–18.0)

## 2016-06-07 LAB — APTT: aPTT: 43 seconds — ABNORMAL HIGH (ref 24–36)

## 2016-06-07 LAB — PROTEIN, PLEURAL OR PERITONEAL FLUID: Total protein, fluid: <3 g/dL

## 2016-06-07 LAB — ALBUMIN, PLEURAL OR PERITONEAL FLUID: Albumin, Fluid: 1.9 g/dL

## 2016-06-07 LAB — MAGNESIUM: MAGNESIUM: 2 mg/dL (ref 1.7–2.4)

## 2016-06-07 LAB — PREPARE RBC (CROSSMATCH)

## 2016-06-07 MED ORDER — ROCURONIUM BROMIDE 50 MG/5ML IV SOLN
50.0000 mg | Freq: Once | INTRAVENOUS | Status: AC
  Administered 2016-06-07: 50 mg via INTRAVENOUS

## 2016-06-07 MED ORDER — VANCOMYCIN HCL IN DEXTROSE 1-5 GM/200ML-% IV SOLN
1000.0000 mg | INTRAVENOUS | Status: DC
  Administered 2016-06-08: 1000 mg via INTRAVENOUS
  Filled 2016-06-07: qty 200

## 2016-06-07 MED ORDER — SODIUM CHLORIDE 0.9 % IV SOLN
1.0000 g | Freq: Two times a day (BID) | INTRAVENOUS | Status: DC
  Administered 2016-06-07 – 2016-06-08 (×3): 1 g via INTRAVENOUS
  Filled 2016-06-07 (×4): qty 1

## 2016-06-07 MED ORDER — NOREPINEPHRINE BITARTRATE 1 MG/ML IV SOLN
0.0000 ug/min | INTRAVENOUS | Status: DC
  Administered 2016-06-07: 8 ug/min via INTRAVENOUS
  Administered 2016-06-07: 20 ug/min via INTRAVENOUS
  Administered 2016-06-08 (×2): 16 ug/min via INTRAVENOUS
  Administered 2016-06-09: 42 ug/min via INTRAVENOUS
  Filled 2016-06-07 (×11): qty 4

## 2016-06-07 MED ORDER — MORPHINE SULFATE (PF) 2 MG/ML IV SOLN
INTRAVENOUS | Status: AC
  Filled 2016-06-07: qty 1

## 2016-06-07 MED ORDER — IPRATROPIUM-ALBUTEROL 0.5-2.5 (3) MG/3ML IN SOLN
3.0000 mL | RESPIRATORY_TRACT | Status: DC | PRN
  Administered 2016-06-07: 3 mL via RESPIRATORY_TRACT
  Filled 2016-06-07: qty 3

## 2016-06-07 MED ORDER — ORAL CARE MOUTH RINSE
15.0000 mL | OROMUCOSAL | Status: DC
  Administered 2016-06-07 – 2016-06-09 (×14): 15 mL via OROMUCOSAL

## 2016-06-07 MED ORDER — ROCURONIUM BROMIDE 50 MG/5ML IV SOLN
INTRAVENOUS | Status: AC
  Filled 2016-06-07: qty 1

## 2016-06-07 MED ORDER — SODIUM BICARBONATE 8.4 % IV SOLN
150.0000 meq | Freq: Once | INTRAVENOUS | Status: AC
  Administered 2016-06-08: 150 meq via INTRAVENOUS
  Filled 2016-06-07: qty 150

## 2016-06-07 MED ORDER — MORPHINE SULFATE (PF) 2 MG/ML IV SOLN
1.0000 mg | Freq: Once | INTRAVENOUS | Status: AC
  Administered 2016-06-07: 1 mg via INTRAVENOUS

## 2016-06-07 MED ORDER — ATROPINE SULFATE 1 MG/10ML IJ SOSY
PREFILLED_SYRINGE | INTRAMUSCULAR | Status: AC
  Filled 2016-06-07: qty 10

## 2016-06-07 MED ORDER — VANCOMYCIN HCL IN DEXTROSE 1-5 GM/200ML-% IV SOLN
1000.0000 mg | INTRAVENOUS | Status: DC
  Filled 2016-06-07: qty 200

## 2016-06-07 MED ORDER — FENTANYL 2500MCG IN NS 250ML (10MCG/ML) PREMIX INFUSION
0.0000 ug/h | INTRAVENOUS | Status: DC
  Administered 2016-06-07: 50 ug/h via INTRAVENOUS
  Administered 2016-06-08: 100 ug/h via INTRAVENOUS
  Administered 2016-06-09: 125 ug/h via INTRAVENOUS
  Filled 2016-06-07 (×3): qty 250

## 2016-06-07 MED ORDER — FENTANYL CITRATE (PF) 100 MCG/2ML IJ SOLN
INTRAMUSCULAR | Status: AC
  Filled 2016-06-07: qty 2

## 2016-06-07 MED ORDER — FENTANYL CITRATE (PF) 100 MCG/2ML IJ SOLN
50.0000 ug | INTRAMUSCULAR | Status: DC | PRN
  Administered 2016-06-07 (×2): 100 ug via INTRAVENOUS
  Administered 2016-06-08: 50 ug via INTRAVENOUS
  Filled 2016-06-07 (×2): qty 2

## 2016-06-07 MED ORDER — MIDAZOLAM HCL 2 MG/2ML IJ SOLN
INTRAMUSCULAR | Status: AC
  Filled 2016-06-07: qty 2

## 2016-06-07 MED ORDER — VANCOMYCIN HCL IN DEXTROSE 1-5 GM/200ML-% IV SOLN
1000.0000 mg | Freq: Once | INTRAVENOUS | Status: AC
  Administered 2016-06-07: 1000 mg via INTRAVENOUS
  Filled 2016-06-07: qty 200

## 2016-06-07 MED ORDER — FENTANYL CITRATE (PF) 100 MCG/2ML IJ SOLN
50.0000 ug | Freq: Once | INTRAMUSCULAR | Status: AC
  Administered 2016-06-07: 50 ug via INTRAVENOUS

## 2016-06-07 MED ORDER — MIDAZOLAM HCL 2 MG/2ML IJ SOLN
2.0000 mg | Freq: Once | INTRAMUSCULAR | Status: AC
  Administered 2016-06-07: 2 mg via INTRAVENOUS

## 2016-06-07 MED ORDER — FUROSEMIDE 10 MG/ML IJ SOLN
40.0000 mg | Freq: Once | INTRAMUSCULAR | Status: AC
  Administered 2016-06-08: 40 mg via INTRAVENOUS
  Filled 2016-06-07: qty 4

## 2016-06-07 MED ORDER — MIDAZOLAM HCL 2 MG/2ML IJ SOLN: 1.0000 mg | Freq: Once | INTRAMUSCULAR | Status: DC

## 2016-06-07 MED ORDER — CHLORHEXIDINE GLUCONATE 0.12% ORAL RINSE (MEDLINE KIT)
15.0000 mL | Freq: Two times a day (BID) | OROMUCOSAL | Status: DC
  Administered 2016-06-07 – 2016-06-08 (×3): 15 mL via OROMUCOSAL

## 2016-06-07 MED ORDER — FUROSEMIDE 10 MG/ML IJ SOLN
80.0000 mg | Freq: Once | INTRAMUSCULAR | Status: AC
  Administered 2016-06-07: 80 mg via INTRAVENOUS
  Filled 2016-06-07: qty 8

## 2016-06-07 MED ORDER — SODIUM CHLORIDE 0.9 % IV SOLN: Freq: Once | INTRAVENOUS | Status: DC

## 2016-06-07 NOTE — Progress Notes (Signed)
Name: Benjamin Bradley MRN: [id number] DOB: 10/26/1960    ADMISSION DATE:  Oct 13, 2016 CONSULTATION DATE:  06/01/2016   SUBJECTIVE:  Patient is lethargic and on the ventilator. Review of systems cannot be obtained given critical illness.  REVIEW OF SYSTEMS:   Could not be obtained, the patient does not want to answer questions.   VITAL SIGNS: Temp:  [97.5 F (36.4 C)] 97.5 F (36.4 C) (06/02 0056) Pulse Rate:  [62-111] 95 (06/02 0900) Resp:  [14-37] 16 (06/02 0900) BP: (46-152)/(34-93) 99/65 (06/02 0900) SpO2:  [74 %-100 %] 95 % (06/02 0900) FiO2 (%):  [90 %-100 %] 90 % (06/02 0811)  PHYSICAL EXAMINATION: General:  Pale cachectic appearing,  Neuro:  Awake, Alert and Oriented, but not wanting to talk HEENT:  AT.[place],PERRLA, Cardiovascular:  S1S2,Regular,no m/r/g noted Lungs:  Diminished air entry, no wheezes,crackles, rhonchi noted Abdomen:  Firm , Non tender, nondistended Musculoskeletal:  No edema, cyanosis noted Skin:  Warm.dry and intact.   Recent Labs Lab 06/05/16 1424 06/06/16 0452 06/07/16 0356  NA 137 139 142  K 5.2* 4.8 5.1  CL 108 111 113*  CO2 19* 18* 17*  BUN 58* 58* 57*  CREATININE 2.36* 2.40* 2.44*  GLUCOSE 86 91 170*    Recent Labs Lab 02018/05/29 1706 05/22/2016 0132 06/05/2016 0558  06/06/16 0452 06/06/16 1258 06/07/16 0356  HGB 6.5* 7.4* 8.5*  < > 9.1* 9.6* 9.6*  HCT 18.8* 21.9* 25.0*  --  26.4*  --   --   WBC 6.8 6.3  --   --  10.5  --   --   PLT 163 146*  --   --  148*  --   --   < > = values in this interval not displayed. Dg Abd 1 View  Result Date: 06/07/2016 CLINICAL DATA:  Orogastric tube placement.  Initial encounter. EXAM: ABDOMEN - 1 VIEW COMPARISON:  CT of the abdomen and pelvis performed 02/07/2004 FINDINGS: The patient's enteric tube is noted ending overlying the body of the stomach. The stomach is largely filled with air. The visualized bowel gas pattern is grossly unremarkable. No free intra-abdominal air is seen, though evaluation  for free air is limited on a single supine view. No acute osseous abnormalities are seen. Small right and moderate to large left-sided pleural effusions are seen. IMPRESSION: 1. Enteric tube noted ending overlying the body of the stomach. 2. Small right and moderate to large left-sided pleural effusions noted. Electronically Signed   By: Roanna RaiderJeffery  Chang M.D.   On: 06/07/2016 06:43   [place] Renal  Result Date: 06/06/2016 CLINICAL DATA:  Renal failure EXAM: RENAL / URINARY TRACT ULTRASOUND COMPLETE COMPARISON:  CT abdomen and pelvis February 07, 2004 FINDINGS: Right Kidney: Length: 10.7 cm. Echogenicity is diffusely increased. The renal cortical thickness is normal. No mass, perinephric fluid, or hydronephrosis visualized. No sonographically demonstrable calculus or ureterectasis. Left Kidney: Length: 9.8 cm. Echogenicity is diffusely increased. Renal cortical thickness is normal. No mass, perinephric fluid, or hydronephrosis visualized. There is no sonographically demonstrable calculus or ureterectasis. Bladder: Appears normal for degree of bladder distention. There is extensive ascites. IMPRESSION: Kidneys are echogenic bilaterally, a finding indicative of medical renal disease. No obstructing focus in either kidney. Renal cortical thickness bilaterally. Kidneys otherwise unremarkable in appearance. There is extensive ascites. Electronically Signed   By: Bretta BangWilliam  Woodruff III M.D.   On: 06/06/2016 15:54   Dg Chest Port 1 View  Result Date: 06/07/2016 CLINICAL DATA:  Endotracheal tube placement.  Initial encounter.  EXAM: PORTABLE CHEST 1 VIEW COMPARISON:  Chest radiograph performed earlier today at 12:37 a.m. FINDINGS: The patient's endotracheal tube is seen ending 4-5 cm above the carina. An enteric tube is seen extending below the diaphragm. Small right and moderate to large left pleural effusions are noted, improved on the left from the prior study. Underlying airspace opacification may reflect pulmonary edema or  possibly pneumonia. The cardiomediastinal silhouette is normal in size. No acute osseous abnormalities are identified. IMPRESSION: 1. Endotracheal tube seen ending 4-5 cm above the carina. 2. Small right and moderate to large left pleural effusions, improved on the left from the recent prior study, though this may simply reflect improved lung expansion. Underlying airspace opacification may reflect pulmonary edema or possibly pneumonia. Electronically Signed   By: Roanna Raider M.D.   On: 06/07/2016 06:41   Dg Chest Port 1 View  Result Date: 06/07/2016 CLINICAL DATA:  Acute onset of cough.  Initial encounter. EXAM: PORTABLE CHEST 1 VIEW COMPARISON:  Chest radiograph performed 05/26/2016 FINDINGS: There is complete opacification of the left hemithorax, reflecting an enlarging left-sided pleural effusion. Worsening right-sided airspace opacification raises concern for pneumonia. A small right pleural effusion is again noted. Vascular congestion is noted, and superimposed pulmonary edema is a concern. The cardiomediastinal silhouette is not well assessed due to opacification of the left hemithorax. No acute osseous abnormalities are seen. Cervical spinal fusion hardware is noted. IMPRESSION: 1. Complete opacification of the left hemithorax, reflecting an enlarging left-sided pleural effusion. Diagnostic thoracentesis could be considered for further evaluation. 2. Small right pleural effusion. Worsening right-sided airspace opacification raises concern for pneumonia. 3. Vascular congestion noted. Superimposed pulmonary edema is a concern. Electronically Signed   By: Roanna Raider M.D.   On: 06/07/2016 00:49    ASSESSMENT / PLAN:   Large left sided pleural effusion, now increased in size with mediastinal shift to the right, complicated by acute respiratory failure. I personally reviewed the patient's chest x-rays which shows a large left-sided pleural effusion with a rightward mediastinal shift, there is also a  small right-sided pleural effusion with some  pulmonary edema, these have led to acute hypoxic respiratory failure, vent dependent.  Acute kidney injury, possibly developing hepatorenal syndrome.   GI bleed in the setting of End Stage Cirrhosis, status post EGD with findings of  bleeding esophageal varices, status post banding. Continues to have maroon stools, however hemoglobins appear to be stable.  MSSA (methicillin susceptible Staphylococcus aureus) infection   Acute Blood Loss Anemia  Acute on Chronic Kidney disease   End Stage Cirrhosis  Abdominal Pain   Plan We'll perform thoracentesis of the left side today. Wean down vent support as tolerated. Monitor hemoglobin. Transfusion of PRBC as needed.  Overall poor prognosis, continue discussions with palliative care.   Wells Guiles, M.D. 06/07/2016   Critical Care Attestation.  I have personally obtained a history, examined the patient, evaluated laboratory and imaging results, formulated the assessment and plan and placed orders. The Patient requires high complexity decision making for assessment and support, frequent evaluation and titration of therapies, application of advanced monitoring technologies and extensive interpretation of multiple databases. The patient has critical illness that could lead imminently to failure of 1 or more organ systems and requires the highest level of physician preparedness to intervene.  Critical Care Time devoted to patient care services described in this note is 35 minutes and is exclusive of time spent in procedures.

## 2016-06-07 NOTE — Progress Notes (Signed)
Pt requires NRB at 16 L to maintain O2 levels. MD called. CXR ordered. Lasix and antibiotics administered. Albumen administered. Pt Oxygen is WNL now. Wife updated.

## 2016-06-07 NOTE — Progress Notes (Signed)
Patient in bed asleep.  Tolerating medications.  Family not in room at this time.

## 2016-06-07 NOTE — Progress Notes (Signed)
Critical hemoglobin called from lab.  Hemoglobin dropped from 9.6 to 5.6.  Dr. Cherlynn KaiserSainani made aware.  He verbally ordered 2 Units PRBC's to be infused.  Type and screen ordered, and 2 units to be prepared.

## 2016-06-07 NOTE — Progress Notes (Signed)
Patient arrived to unit from 1A alert, on bipap, VSS and no s/sx of resp distress.  Patient then intubated by [place], NP with no issues, bilateral lung sounds coarse and large amount of frothy sputum suctioned from ETT.  OGT placed and Xray at bedside.  Patient began to drop O2 sats steadily into low 80s, upper 70s.  Disconnected from vent by RT and bagged with little to no improvement in O2 sats.  BP trending downward and 500ml fluid bolus given.  Patient then began to brady into HR 40s; 1amp atropine given.  [facility] placed orders for levophed and stated pt should be turned to the right side as much as possible.  Once patient placed on right side, vitals stabilized and pt was reconnected back to vent with O2 sats 99%.  Wife, son, and daughter at bedside at this time and updated by Dr. Ardyth Manam regarding what occurred.  Report given to Madelaine BhatAdam, RN.

## 2016-06-07 NOTE — Procedures (Signed)
Left Internal Jugular Central Venous Catheter Insertion Procedure Note Benjamin Bradley [id number] [birth year]  Procedure: Insertion of Central Venous Catheter Indications: Assessment of intravascular volume, Drug and/or fluid administration and Frequent blood sampling  Procedure Details Consent: Risks of procedure as well as the alternatives and risks of each were explained to the (patient/caregiver).  Consent for procedure obtained. Time Out: Verified patient identification, verified procedure, site/side was marked, verified correct patient position, special equipment/implants available, medications/allergies/relevent history reviewed, required imaging and test results available.  Performed  Maximum sterile technique was used including antiseptics, cap, gloves, gown, hand hygiene, mask and sheet. Skin prep: Chlorhexidine; local anesthetic administered A antimicrobial bonded/coated triple lumen catheter was placed in the left internal jugular vein using the Seldinger technique.  Evaluation Blood flow good Complications: No apparent complications Patient did tolerate procedure well. Chest X-ray ordered to verify placement.  CXR: normal.  Procedure performed under direct supervision of Dr.Morayo Leven. Ultrasound utilized for realtime vessel cannulation  Benjamin Bradley ANP-BC Pulmonary and Critical Care Medicine [facility] Pager [phone] or [phone]  06/07/2016, 10:27 PM

## 2016-06-07 NOTE — Progress Notes (Signed)
Patient awake, pulling on vent tubing. Repositioned, blankets given to patient, pain medication given.  Family has returned to room at this time.

## 2016-06-07 NOTE — Progress Notes (Signed)
Pharmacy Antibiotic Note  Benjamin Bradley is a 56 y.o. male admitted on 05/18/2016 with pneumonia and SBP  Pharmacy has been consulted for vancomycin and meropenem dosing.  Plan: DW 59kg  Vd 41L kei 0.028 hr-1  T1/2 25 hours Vancomycin 1 gram q 36 hours ordered with stacked dosing. Level before 4th dose. Goal trough 15-20.  Meropenem 1 gram q 12 hours ordered.  Height: 5\' 11"  (180.3 cm) Weight: 130 lb 8.2 oz (59.2 kg) IBW/kg (Calculated) : 75.3  Temp (24hrs), Avg:98.3 F (36.8 C), Min:97.5 F (36.4 C), Max:98.9 F (37.2 C)   Recent Labs Lab 06/02/16 1800 05/16/2016 1706 05/13/2016 0132 06/05/16 0313 06/05/16 1424 06/06/16 0452  WBC 8.8 6.8 6.3  --   --  10.5  CREATININE 1.90* 1.96* 1.97* 2.18* 2.36* 2.40*    Estimated Creatinine Clearance: 28.8 mL/min (A) (by C-G formula based on SCr of 2.4 mg/dL (H)).    Allergies  Allergen Reactions  . Nsaids Other (See Comments)    Cause severe hepatic reaction due to liver disease  . Vioxx [Rofecoxib] Other (See Comments)    Causes hepatic reaction due to liver disease  . Codeine Nausea And Vomiting    Antimicrobials this admission: ceftriaxone 5/30 >> vancomycin, meropenem 6/2   >>   Dose adjustments this admission:   Microbiology results: 5/30 MRSA PCR: (-)      6/1 UA: 9-) 6/2 CXR: worsening R opacification  Thank you for allowing pharmacy to be a part of this patient'Bradley care.  Benjamin Bradley 06/07/2016 1:35 AM

## 2016-06-07 NOTE — Progress Notes (Signed)
eLink Physician-Brief Progress Note Patient Name: Benjamin Bradley DOB: 10-21-1960 MRN: [id number]   Date of Service  06/07/2016  HPI/Events of Note  [age] M had been in ICU prior with h/o of variceal bleeding.  Now with white out of left lung field and drop in sats.  Intubated by physician extender.  Sats in the 70s/80s with bradycardia.  Given 1 of atropine and bagged with right side down.  Now with sats of 95% on 10 of peep and 100%.  BP 60/43 (49)  eICU Interventions  Continue with current vent settings Check ABG NE for BP support [facility]     Intervention Category Evaluation Type: New Patient Evaluation  DETERDING,ELIZABETH 06/07/2016, 6:09 AM

## 2016-06-07 NOTE — Progress Notes (Signed)
[employer] [place] [facility]   PATIENT NAME: Benjamin Bradley    MR#:  [id number]  DATE OF BIRTH:  1960-07-07  SUBJECTIVE:   Patient here with a acute GI bleed secondary to esophageal variceal bleeding, status post endoscopy and banding on 05/28/2016. Overnight became hypoxic and short of breath due to anasarca and pleural effusions and intubated and transferred to the ICU. Remains critically ill. On low-dose Levophed, FiO2 at 60% on the vent.  REVIEW OF SYSTEMS:    Review of Systems  Unable to perform ROS: Intubated    Nutrition: NPO Tolerating Diet: No Tolerating PT: Await Eval.   DRUG ALLERGIES:   Allergies  Allergen Reactions  . Nsaids Other (See Comments)    Cause severe hepatic reaction due to liver disease  . Vioxx [Rofecoxib] Other (See Comments)    Causes hepatic reaction due to liver disease  . Codeine Nausea And Vomiting    VITALS:  Blood pressure (!) 95/58, pulse 80, temperature (!) 96.4 F (35.8 C), resp. rate 19, height 5\' 11"  (1.803 m), weight 59.2 kg (130 lb 8.2 oz), SpO2 (!) 89 %.  PHYSICAL EXAMINATION:   Physical Exam  GENERAL:  56 y.o.-year-old emaciated/cachectic patient lying in bed critically ill.   EYES: Pupils equal, round, reactive to light. No scleral icterus. Extraocular muscles intact.  HEENT: Head atraumatic, normocephalic. Oropharynx and nasopharynx clear.  NECK:  Supple, no jugular venous distention. No thyroid enlargement, no tenderness.  LUNGS: Normal breath sounds bilaterally, no wheezing, rales, rhonchi. No use of accessory muscles of respiration.  CARDIOVASCULAR: S1, S2 normal. No murmurs, rubs, or gallops.  ABDOMEN: Soft, nontender, nondistended. Bowel sounds present. No organomegaly or mass. + fluid wave and distension due to ascites.   EXTREMITIES: No cyanosis, clubbing, + 1-2 edema b/l   NEUROLOGIC: Sedated & Intubated PSYCHIATRIC: Sedated & intubated SKIN: No obvious rash, lesion, or ulcer.     LABORATORY PANEL:   CBC  Recent Labs Lab 06/06/16 0452  06/07/16 0356  WBC 10.5  --   --   HGB 9.1*  < > 9.6*  HCT 26.4*  --   --   PLT 148*  --   --   < > = values in this interval not displayed. ------------------------------------------------------------------------------------------------------------------  Chemistries   Recent Labs Lab 06/05/16 0313  06/07/16 0356  NA 139  < > 142  K 5.4*  < > 5.1  CL 112*  < > 113*  CO2 21*  < > 17*  GLUCOSE 97  < > 170*  BUN 58*  < > 57*  CREATININE 2.18*  < > 2.44*  CALCIUM 8.4*  < > 9.2  AST 31  --   --   ALT 16*  --   --   ALKPHOS 46  --   --   BILITOT 1.6*  --   --   < > = values in this interval not displayed. ------------------------------------------------------------------------------------------------------------------  Cardiac Enzymes  Recent Labs Lab 05/11/2016 1342  TROPONINI <0.03   ------------------------------------------------------------------------------------------------------------------  RADIOLOGY:  Dg Chest 1 View  Result Date: 06/07/2016 CLINICAL DATA:  Status post right chest thoracentesis today. EXAM: CHEST 1 VIEW COMPARISON:  Chest x-rays from earlier same day and chest x-ray dated 05/15/2016. FINDINGS: There is improved aeration of the left lung compared to the most recent chest x-ray from earlier today, previously moderate to large left pleural effusion. Veiled opacity at the mid and lower zones of the left lung suggests some persistent layering pleural effusion. There  is significantly increasing opacification of the right hemithorax, presumably pleural effusion and/or airspace collapse. Endotracheal tube appears stable in position with tip approximately 3.5 cm above the carina. Enteric tube passes below the diaphragm. Heart size and mediastinal contours are grossly stable, partially obscured by the airspace opacities. IMPRESSION: 1. SIGNIFICANTLY INCREASED OPACIFICATION of the RIGHT hemithorax,  presumably large pleural effusion and/or airspace collapse. 2. Significantly improved aeration of the left lung, suspect small amount of persistent layering pleural effusion. 3. Support apparatus appears appropriately positioned. These results will be called to the ordering clinician or representative by the Radiologist Assistant, and communication documented in the PACS or zVision Dashboard. Electronically Signed   By: Bary Richard M.D.   On: 06/07/2016 11:00   Dg Abd 1 View  Result Date: 06/07/2016 CLINICAL DATA:  Orogastric tube placement.  Initial encounter. EXAM: ABDOMEN - 1 VIEW COMPARISON:  CT of the abdomen and pelvis performed 02/07/2004 FINDINGS: The patient's enteric tube is noted ending overlying the body of the stomach. The stomach is largely filled with air. The visualized bowel gas pattern is grossly unremarkable. No free intra-abdominal air is seen, though evaluation for free air is limited on a single supine view. No acute osseous abnormalities are seen. Small right and moderate to large left-sided pleural effusions are seen. IMPRESSION: 1. Enteric tube noted ending overlying the body of the stomach. 2. Small right and moderate to large left-sided pleural effusions noted. Electronically Signed   By: Roanna Raider M.D.   On: 06/07/2016 06:43   US Renal  Result Date: 06/06/2016 CLINICAL DATA:  Renal failure EXAM: RENAL / URINARY TRACT ULTRASOUND COMPLETE COMPARISON:  CT abdomen and pelvis February 07, 2004 FINDINGS: Right Kidney: Length: 10.7 cm. Echogenicity is diffusely increased. The renal cortical thickness is normal. No mass, perinephric fluid, or hydronephrosis visualized. No sonographically demonstrable calculus or ureterectasis. Left Kidney: Length: 9.8 cm. Echogenicity is diffusely increased. Renal cortical thickness is normal. No mass, perinephric fluid, or hydronephrosis visualized. There is no sonographically demonstrable calculus or ureterectasis. Bladder: Appears normal for degree  of bladder distention. There is extensive ascites. IMPRESSION: Kidneys are echogenic bilaterally, a finding indicative of medical renal disease. No obstructing focus in either kidney. Renal cortical thickness bilaterally. Kidneys otherwise unremarkable in appearance. There is extensive ascites. Electronically Signed   By: Bretta Bang III M.D.   On: 06/06/2016 15:54   Dg Chest Port 1 View  Result Date: 06/07/2016 CLINICAL DATA:  Endotracheal tube placement.  Initial encounter. EXAM: PORTABLE CHEST 1 VIEW COMPARISON:  Chest radiograph performed earlier today at 12:37 a.m. FINDINGS: The patient's endotracheal tube is seen ending 4-5 cm above the carina. An enteric tube is seen extending below the diaphragm. Small right and moderate to large left pleural effusions are noted, improved on the left from the prior study. Underlying airspace opacification may reflect pulmonary edema or possibly pneumonia. The cardiomediastinal silhouette is normal in size. No acute osseous abnormalities are identified. IMPRESSION: 1. Endotracheal tube seen ending 4-5 cm above the carina. 2. Small right and moderate to large left pleural effusions, improved on the left from the recent prior study, though this may simply reflect improved lung expansion. Underlying airspace opacification may reflect pulmonary edema or possibly pneumonia. Electronically Signed   By: Roanna Raider M.D.   On: 06/07/2016 06:41   Dg Chest Port 1 View  Result Date: 06/07/2016 CLINICAL DATA:  Acute onset of cough.  Initial encounter. EXAM: PORTABLE CHEST 1 VIEW COMPARISON:  Chest radiograph performed 05/17/2016 FINDINGS:  There is complete opacification of the left hemithorax, reflecting an enlarging left-sided pleural effusion. Worsening right-sided airspace opacification raises concern for pneumonia. A small right pleural effusion is again noted. Vascular congestion is noted, and superimposed pulmonary edema is a concern. The cardiomediastinal silhouette  is not well assessed due to opacification of the left hemithorax. No acute osseous abnormalities are seen. Cervical spinal fusion hardware is noted. IMPRESSION: 1. Complete opacification of the left hemithorax, reflecting an enlarging left-sided pleural effusion. Diagnostic thoracentesis could be considered for further evaluation. 2. Small right pleural effusion. Worsening right-sided airspace opacification raises concern for pneumonia. 3. Vascular congestion noted. Superimposed pulmonary edema is a concern. Electronically Signed   By: Roanna RaiderJeffery  Chang M.D.   On: 06/07/2016 00:49     ASSESSMENT AND PLAN:   56 year old male with past medical history of alcohol abuse, alcoholic liver disease with cirrhosis, chronic anemia, diabetes, hepatitis C, depression and was admitted to the hospital due to an upper GI bleed  1. GI bleed-this was the cause of patient's worsening anemia. Patient had a upper GI bleed secondary to esophageal varices. -Seen by gastroenterology and status post banding of the esophageal varices. Hemoglobin stable posttransfusion. Continue Protonix. Currently intubated due to respiratory failure and remains nothing by mouth.  2. Acute respiratory failure with hypoxia-secondary to pleural effusions and anasarca. Patient was hypoxic last night and urgently intubated. Status post right-sided thoracentesis with 2 L of fluid removed. -Continue vent support, and further care as per pulmonary. Currently on 60% FiO2 and wean off the ventilator as per pulmonary.  3. Acute blood loss anemia-secondary to the GI bleed. Continues to have maroon stools and flexes CL. -We'll follow hemoglobin. Transfuse if hemoglobin less than 7.  4. Altered mental status-now intubated and sedated due to respiratory failure. -We'll follow mental status once extubated.  5. Acute kidney injury-patient is acute on chronic renal failure. -As per nephrology baseline creatinine around 1.2. Now up to 2.4. Differential is  hepatorenal versus ATN versus hypovolemic bulimia versus interstitial nephritis. -Await urine sodium. Poor candidate for dialysis. Prognosis poor.  Nephrologist discussed plan with pt's wife.   6. Hypotension - etiology unclear. ?? Sepsis but no clear source identified.  - cont. IV Levophed, empiric abx. With Vanc, Zosyn.  Follow cutlures.   Overall prognosis Is very poor given multiple comorbidities and chronic liver disease. Palliative care consult has been placed.   All the records are reviewed and case discussed with Care Management/Social Worker. Management plans discussed with the patient, family and they are in agreement.  CODE STATUS: Full   DVT Prophylaxis: Teds and SCDs   TOTAL TIME TAKING CARE OF THIS PATIENT: 30  minutes.   POSSIBLE D/C unclear , DEPENDING ON CLINICAL CONDITION.   Houston SirenSAINANI,VIVEK J M.D on 06/07/2016 at 2:37 PM  Between 7am to 6pm - Pager - [phone]  After 6pm go to [email or web address] - Social research officer, governmentpassword [email or web address]  [facility] [place] [facility]  Office  [phone]  CC: Primary care physician; Patient, No Pcp Per
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
## 2016-06-07 NOTE — Procedures (Signed)
PROCEDURE NOTE: Left THORACENTESIS    Date: 06/07/2016,   [id number] MRN# [id number] Benjamin Bradley [facility]   Indication: Pleural effusion on left side  A time-out was completed verifying correct patient, procedure, site, positioning, and implant(s) or special equipment if applicable.  Ultrasound guidance was used and appropriate fluid pocket was identified and marked, and the left anterior axillary line at approximately the eighth interspace. A large pocket of pleural effusion was seen here with atelectatic lung with them..   Patient was positioned, prepped and draped in usual sterile fashion.  Using the thoracentesis kit, a needle was introduced into the pleural space and fluid was removed. Blood loss was none  A chest xray was ordered to evaluate for pneumothorax.  Total Fluid Removed: 2200 mL Color of Fluid: Serous, straw-colored   Patient tolerated the procedure well, there were no Medications.  Marda Stalker, M.D. Pulmonary & Critical Care Medicine

## 2016-06-07 NOTE — Progress Notes (Signed)
Physician at bedside performing thoracentesis.  Patient tolerated well.  Removed 2.2 L fluid. Patient resting in bed at this time.

## 2016-06-07 NOTE — Progress Notes (Signed)
PT care assumed at this time. [facility]. Family at bedside.

## 2016-06-07 NOTE — Procedures (Signed)
Intubation Procedure Note Benjamin ClasHarold D Bradley [id number] Mar 15, 1960  Procedure: Intubation Indications: Respiratory insufficiency  Procedure Details Consent: Unable to obtain consent because of emergent medical necessity. Time Out: Verified patient identification, verified procedure, site/side was marked, verified correct patient position, special equipment/implants available, medications/allergies/relevent history reviewed, required imaging and test results available.  Performed  Drugs:  50 mcg Fentanyl, 2mg  Versed, 50 mg Rocuronium. DLwith # 3 blade. Grade 1view. ET tube visualized passing through vocal cords. Following intubation:  positive color change on ETCO2, condensation seen in endotracheal tube, equal breath sounds bilaterally.  Evaluation Hemodynamic Status: Transient hypotension treated with fluid; O2 sats: stable throughout Patient's Current Condition: stable Complications: No apparent complications Patient did tolerate procedure well. Chest X-ray ordered to verify placement.  CXR: pending.   Benjamin Bradley,AG-ACNP Pulmonary & Critical Care

## 2016-06-08 ENCOUNTER — Inpatient Hospital Stay: Payer: Medicare Other

## 2016-06-08 DIAGNOSIS — Z9889 Other specified postprocedural states: Secondary | ICD-10-CM

## 2016-06-08 LAB — CBC WITH DIFFERENTIAL/PLATELET
Basophils Absolute: 0 10*3/uL (ref 0–0.1)
Basophils Relative: 0 %
EOS PCT: 0 %
Eosinophils Absolute: 0 10*3/uL (ref 0–0.7)
HEMATOCRIT: 23.8 % — AB (ref 40.0–52.0)
Hemoglobin: 8 g/dL — ABNORMAL LOW (ref 13.0–18.0)
LYMPHS ABS: 1.1 10*3/uL (ref 1.0–3.6)
LYMPHS PCT: 6 %
MCH: 31.2 pg (ref 26.0–34.0)
MCHC: 33.7 g/dL (ref 32.0–36.0)
MCV: 92.7 fL (ref 80.0–100.0)
MONO ABS: 1.1 10*3/uL — AB (ref 0.2–1.0)
Monocytes Relative: 6 %
Neutro Abs: 16.9 10*3/uL — ABNORMAL HIGH (ref 1.4–6.5)
Neutrophils Relative %: 88 %
PLATELETS: 89 10*3/uL — AB (ref 150–440)
RBC: 2.57 MIL/uL — ABNORMAL LOW (ref 4.40–5.90)
RDW: 19 % — AB (ref 11.5–14.5)
WBC: 19.2 10*3/uL — ABNORMAL HIGH (ref 3.8–10.6)

## 2016-06-08 LAB — BASIC METABOLIC PANEL
ANION GAP: 9 (ref 5–15)
Anion gap: 9 (ref 5–15)
BUN: 59 mg/dL — ABNORMAL HIGH (ref 6–20)
BUN: 60 mg/dL — ABNORMAL HIGH (ref 6–20)
CHLORIDE: 109 mmol/L (ref 101–111)
CO2: 20 mmol/L — ABNORMAL LOW (ref 22–32)
CO2: 23 mmol/L (ref 22–32)
CREATININE: 2.56 mg/dL — AB (ref 0.61–1.24)
Calcium: 8.7 mg/dL — ABNORMAL LOW (ref 8.9–10.3)
Calcium: 8.2 mg/dL — ABNORMAL LOW (ref 8.9–10.3)
Chloride: 112 mmol/L — ABNORMAL HIGH (ref 101–111)
Creatinine, Ser: 2.66 mg/dL — ABNORMAL HIGH (ref 0.61–1.24)
GFR calc Af Amer: 29 mL/min — ABNORMAL LOW (ref >60)
GFR, EST AFRICAN AMERICAN: 31 mL/min — AB (ref >60)
GFR, EST NON AFRICAN AMERICAN: 26 mL/min — AB (ref >60)
GFR, EST NON AFRICAN AMERICAN: 25 mL/min — AB (ref >60)
GLUCOSE: 146 mg/dL — AB (ref 65–99)
Glucose, Bld: 109 mg/dL — ABNORMAL HIGH (ref 65–99)
POTASSIUM: 4.3 mmol/L (ref 3.5–5.1)
Potassium: 4.4 mmol/L (ref 3.5–5.1)
SODIUM: 141 mmol/L (ref 135–145)
Sodium: 141 mmol/L (ref 135–145)

## 2016-06-08 LAB — BLOOD GAS, ARTERIAL
Acid-base deficit: 3.9 mmol/L — ABNORMAL HIGH (ref 0.0–2.0)
BICARBONATE: 22.2 mmol/L (ref 20.0–28.0)
FIO2: 0.65
MECHANICAL RATE: 16
O2 Saturation: 99.3 %
PEEP/CPAP: 10 cmH2O
Patient temperature: 37
VT: 450 mL
pCO2 arterial: 44 mmHg (ref 32.0–48.0)
pH, Arterial: 7.31 — ABNORMAL LOW (ref 7.350–7.450)
pO2, Arterial: 164 mmHg — ABNORMAL HIGH (ref 83.0–108.0)

## 2016-06-08 LAB — CBC
HEMATOCRIT: 25.3 % — AB (ref 40.0–52.0)
HEMOGLOBIN: 8.7 g/dL — AB (ref 13.0–18.0)
MCH: 31.6 pg (ref 26.0–34.0)
MCHC: 34.2 g/dL (ref 32.0–36.0)
MCV: 92.3 fL (ref 80.0–100.0)
PLATELETS: 122 10*3/uL — AB (ref 150–440)
RBC: 2.74 MIL/uL — AB (ref 4.40–5.90)
RDW: 18.2 % — ABNORMAL HIGH (ref 11.5–14.5)
WBC: 17.9 10*3/uL — AB (ref 3.8–10.6)

## 2016-06-08 LAB — GLUCOSE, CAPILLARY
GLUCOSE-CAPILLARY: 138 mg/dL — AB (ref 65–99)
GLUCOSE-CAPILLARY: 131 mg/dL — AB (ref 65–99)
GLUCOSE-CAPILLARY: 149 mg/dL — AB (ref 65–99)
Glucose-Capillary: 152 mg/dL — ABNORMAL HIGH (ref 65–99)
Glucose-Capillary: 149 mg/dL — ABNORMAL HIGH (ref 65–99)
Glucose-Capillary: 135 mg/dL — ABNORMAL HIGH (ref 65–99)

## 2016-06-08 LAB — LACTIC ACID, PLASMA: Lactic Acid, Venous: 1.8 mmol/L (ref 0.5–1.9)

## 2016-06-08 LAB — MAGNESIUM: Magnesium: 1.9 mg/dL (ref 1.7–2.4)

## 2016-06-08 MED ORDER — SODIUM CHLORIDE 0.9% FLUSH: 10.0000 mL | INTRAVENOUS | Status: DC | PRN

## 2016-06-08 MED ORDER — STERILE WATER FOR INJECTION IJ SOLN
INTRAMUSCULAR | Status: AC
  Filled 2016-06-08: qty 10

## 2016-06-08 MED ORDER — SODIUM CHLORIDE 0.9% FLUSH
10.0000 mL | Freq: Two times a day (BID) | INTRAVENOUS | Status: DC
  Administered 2016-06-08: 10 mL

## 2016-06-08 MED ORDER — SODIUM CHLORIDE 0.9 % IV SOLN
500.0000 mg | Freq: Two times a day (BID) | INTRAVENOUS | Status: DC
  Administered 2016-06-08 – 2016-06-09 (×2): 500 mg via INTRAVENOUS
  Filled 2016-06-08 (×3): qty 0.5

## 2016-06-08 NOTE — Progress Notes (Signed)
Patient in bed HOB elevated.  Wife at bedside.  Doctor in to speak with wife about POC and code status.  Code status changed to DNR. Family members and pastor in for visit and prayers.

## 2016-06-08 NOTE — Progress Notes (Signed)
Frank red blood draining from subglottic suctioning.  Approximately 50 mls collected at this time.  [facility]  Will continue to monitor.

## 2016-06-08 NOTE — Progress Notes (Addendum)
Pharmacy Antibiotic Note  Benjamin Bradley is a 56 y.o. male admitted on 22-Nov-2016 with pneumonia and SBP  Pharmacy has been consulted for Merrem   Plan: Will change Merrem dose to 500 mg IV q12 hours.  Height: 5\' 11"  (180.3 cm) Weight: 130 lb 8.2 oz (59.2 kg) IBW/kg (Calculated) : 75.3  Temp (24hrs), Avg:98.1 F (36.7 C), Min:95.5 F (35.3 C), Max:99.1 F (37.3 C)   Recent Labs Lab 16-Mar-2016 1706 05/29/2016 0132  06/05/16 1424 06/06/16 0452 06/07/16 0356 06/07/16 2222 06/08/16 0613  WBC 6.8 6.3  --   --  10.5  --  11.8* 17.9*  CREATININE 1.96* 1.97*  < > 2.36* 2.40* 2.44* 2.56* 2.66*  LATICACIDVEN  --   --   --   --   --   --  1.9 1.8  < > = values in this interval not displayed.  Estimated Creatinine Clearance: 26 mL/min (A) (by C-G formula based on SCr of 2.66 mg/dL (H)).    Allergies  Allergen Reactions  . Nsaids Other (See Comments)    Cause severe hepatic reaction due to liver disease  . Vioxx [Rofecoxib] Other (See Comments)    Causes hepatic reaction due to liver disease  . Codeine Nausea And Vomiting    Antimicrobials this admission: ceftriaxone 5/30 >> vancomycin, meropenem 6/2   >>   Dose adjustments this admission:   Microbiology results: 5/30 MRSA PCR: (-)      6/1 UA: 9-) 6/2 CXR: worsening R opacification  Thank you for allowing pharmacy to be a part of this patient's care.  Farzana Koci D 06/08/2016 10:08 AM

## 2016-06-08 NOTE — Progress Notes (Signed)
GI doctor, Dr. Daleen SquibbWall, notified of drainage of frank blood from subglottic suctioning.  No new orders received.

## 2016-06-08 NOTE — Progress Notes (Signed)
[facility] [place] [facility]   PATIENT NAME: Benjamin Bradley    MR#:  [id number]  DATE OF BIRTH:  1960-09-03  SUBJECTIVE:   Patient here with a acute GI bleed secondary to esophageal variceal bleeding, status post endoscopy and banding on 05/09/2016. Remains anasarca, status post right-sided thoracentesis 2 L of fluid removed. Remains on 60% FiO2 and critically ill with multiorgan failure. Hemoglobin had dropped to below 6 yesterday and transfuse 2 units and hemoglobin up to over 8 today. Family at bedside.  REVIEW OF SYSTEMS:    Review of Systems  Unable to perform ROS: Intubated    Nutrition: NPO Tolerating Diet: No Tolerating PT: Await Eval.   DRUG ALLERGIES:   Allergies  Allergen Reactions  . Nsaids Other (See Comments)    Cause severe hepatic reaction due to liver disease  . Vioxx [Rofecoxib] Other (See Comments)    Causes hepatic reaction due to liver disease  . Codeine Nausea And Vomiting    VITALS:  Blood pressure 112/69, pulse 78, temperature 98.1 F (36.7 C), resp. rate 17, height 5\' 11"  (1.803 m), weight 59.2 kg (130 lb 8.2 oz), SpO2 94 %.  PHYSICAL EXAMINATION:   Physical Exam  GENERAL:  56 y.o.-year-old emaciated/cachectic patient lying in bed critically ill.   EYES: Pupils equal, round, reactive to light. No scleral icterus. Extraocular muscles intact.  HEENT: Head atraumatic, normocephalic. Oropharynx and nasopharynx clear.  NECK:  Supple, no jugular venous distention. No thyroid enlargement, no tenderness.  LUNGS: Normal breath sounds bilaterally, no wheezing, rales, rhonchi. No use of accessory muscles of respiration.  CARDIOVASCULAR: S1, S2 normal. No murmurs, rubs, or gallops.  ABDOMEN: Soft, nontender, nondistended. Bowel sounds present. No organomegaly or mass. + fluid wave and distension due to ascites.   EXTREMITIES: No cyanosis, clubbing, + 1-2 edema b/l   NEUROLOGIC: Sedated & Intubated PSYCHIATRIC: Sedated &  intubated SKIN: No obvious rash, lesion, or ulcer.    LABORATORY PANEL:   CBC  Recent Labs Lab 06/08/16 0613  WBC 17.9*  HGB 8.7*  HCT 25.3*  PLT 122*   ------------------------------------------------------------------------------------------------------------------  Chemistries   Recent Labs Lab 06/05/16 0313  06/08/16 0613  NA 139  < > 141  K 5.4*  < > 4.3  CL 112*  < > 109  CO2 21*  < > 23  GLUCOSE 97  < > 146*  BUN 58*  < > 60*  CREATININE 2.18*  < > 2.66*  CALCIUM 8.4*  < > 8.2*  MG  --   < > 1.9  AST 31  --   --   ALT 16*  --   --   ALKPHOS 46  --   --   BILITOT 1.6*  --   --   < > = values in this interval not displayed. ------------------------------------------------------------------------------------------------------------------  Cardiac Enzymes  Recent Labs Lab 05/07/2016 1342  TROPONINI <0.03   ------------------------------------------------------------------------------------------------------------------  RADIOLOGY:  Dg Chest 1 View  Result Date: 06/08/2016 CLINICAL DATA:  Shortness of breath.  Status post thoracentesis. EXAM: CHEST 1 VIEW COMPARISON:  06/07/2016 and prior exams FINDINGS: Cardiomediastinal silhouette is unchanged. An endotracheal tube with tip 4.5 cm above carina, left IJ central venous catheter with tip overlying the SVC, and NG tube entering the stomach with tip off the field of view again noted. Diffuse bilateral airspace opacities/edema, right greater than left, again noted. Bilateral pleural effusions, right greater than left are unchanged. An equivocal small left apical pneumothorax is noted but decreased  in size from prior study. No other changes noted. IMPRESSION: Equivocal small left apical pneumothorax which is decreased in size from the prior study. No other significant changes. Electronically Signed   By: Harmon Pier M.D.   On: 06/08/2016 07:07   Dg Chest 1 View  Addendum Date: 06/07/2016   ADDENDUM REPORT: 06/07/2016  17:16 ADDENDUM: Correction on the clinical data. This chest x-ray performed following left thoracentesis. Decreasing left pleural effusion noted as described on prior report. No pneumothorax. Electronically Signed   By: Charlett Nose M.D.   On: 06/07/2016 17:16   Result Date: 06/07/2016 CLINICAL DATA:  Status post right chest thoracentesis today. EXAM: CHEST 1 VIEW COMPARISON:  Chest x-rays from earlier same day and chest x-ray dated 06/14/16. FINDINGS: There is improved aeration of the left lung compared to the most recent chest x-ray from earlier today, previously moderate to large left pleural effusion. Veiled opacity at the mid and lower zones of the left lung suggests some persistent layering pleural effusion. There is significantly increasing opacification of the right hemithorax, presumably pleural effusion and/or airspace collapse. Endotracheal tube appears stable in position with tip approximately 3.5 cm above the carina. Enteric tube passes below the diaphragm. Heart size and mediastinal contours are grossly stable, partially obscured by the airspace opacities. IMPRESSION: 1. SIGNIFICANTLY INCREASED OPACIFICATION of the RIGHT hemithorax, presumably large pleural effusion and/or airspace collapse. 2. Significantly improved aeration of the left lung, suspect small amount of persistent layering pleural effusion. 3. Support apparatus appears appropriately positioned. These results will be called to the ordering clinician or representative by the Radiologist Assistant, and communication documented in the PACS or zVision Dashboard. Electronically Signed: By: Bary Richard M.D. On: 06/07/2016 11:00   Dg Abd 1 View  Result Date: 06/07/2016 CLINICAL DATA:  Orogastric tube placement.  Initial encounter. EXAM: ABDOMEN - 1 VIEW COMPARISON:  CT of the abdomen and pelvis performed 02/07/2004 FINDINGS: The patient's enteric tube is noted ending overlying the body of the stomach. The stomach is largely filled with  air. The visualized bowel gas pattern is grossly unremarkable. No free intra-abdominal air is seen, though evaluation for free air is limited on a single supine view. No acute osseous abnormalities are seen. Small right and moderate to large left-sided pleural effusions are seen. IMPRESSION: 1. Enteric tube noted ending overlying the body of the stomach. 2. Small right and moderate to large left-sided pleural effusions noted. Electronically Signed   By: Roanna Raider M.D.   On: 06/07/2016 06:43   US Renal  Result Date: 06/06/2016 CLINICAL DATA:  Renal failure EXAM: RENAL / URINARY TRACT ULTRASOUND COMPLETE COMPARISON:  CT abdomen and pelvis February 07, 2004 FINDINGS: Right Kidney: Length: 10.7 cm. Echogenicity is diffusely increased. The renal cortical thickness is normal. No mass, perinephric fluid, or hydronephrosis visualized. No sonographically demonstrable calculus or ureterectasis. Left Kidney: Length: 9.8 cm. Echogenicity is diffusely increased. Renal cortical thickness is normal. No mass, perinephric fluid, or hydronephrosis visualized. There is no sonographically demonstrable calculus or ureterectasis. Bladder: Appears normal for degree of bladder distention. There is extensive ascites. IMPRESSION: Kidneys are echogenic bilaterally, a finding indicative of medical renal disease. No obstructing focus in either kidney. Renal cortical thickness bilaterally. Kidneys otherwise unremarkable in appearance. There is extensive ascites. Electronically Signed   By: Bretta Bang III M.D.   On: 06/06/2016 15:54   Dg Chest Port 1 View  Result Date: 06/07/2016 CLINICAL DATA:  Central line placement.  Initial encounter. EXAM: PORTABLE CHEST 1 VIEW  COMPARISON:  Chest radiograph performed earlier today at 10:40 a.m. FINDINGS: The patient's endotracheal tube is seen ending 6 cm above the carina. An enteric tube is seen extending below the diaphragm. The patient's left IJ line is noted ending about the mid SVC.  Improving diffuse right-sided airspace opacification is noted, with an underlying small right pleural effusion. Underlying vascular congestion is noted, with hazy central opacity, raising concern for interstitial edema. A small left pleural effusion is also noted. No pneumothorax is seen. The cardiomediastinal silhouette is normal in size. No acute osseous abnormalities are seen. A linear lucency overlying the left hemithorax appears to reflect a skin fold. IMPRESSION: 1. Left IJ line noted ending about the mid SVC. 2. Endotracheal tube seen ending 6 cm above the carina. 3. Improving diffuse right-sided airspace opacification, with underlying small right pleural effusion, concerning for pneumonia. 4. Underlying vascular congestion, with hazy central opacities, concerning for superimposed interstitial edema. Small left pleural effusion noted. Electronically Signed   By: Roanna RaiderJeffery  Chang M.D.   On: 06/07/2016 22:43   Dg Chest Port 1 View  Result Date: 06/07/2016 CLINICAL DATA:  Endotracheal tube placement.  Initial encounter. EXAM: PORTABLE CHEST 1 VIEW COMPARISON:  Chest radiograph performed earlier today at 12:37 a.m. FINDINGS: The patient's endotracheal tube is seen ending 4-5 cm above the carina. An enteric tube is seen extending below the diaphragm. Small right and moderate to large left pleural effusions are noted, improved on the left from the prior study. Underlying airspace opacification may reflect pulmonary edema or possibly pneumonia. The cardiomediastinal silhouette is normal in size. No acute osseous abnormalities are identified. IMPRESSION: 1. Endotracheal tube seen ending 4-5 cm above the carina. 2. Small right and moderate to large left pleural effusions, improved on the left from the recent prior study, though this may simply reflect improved lung expansion. Underlying airspace opacification may reflect pulmonary edema or possibly pneumonia. Electronically Signed   By: Roanna RaiderJeffery  Chang M.D.   On:  06/07/2016 06:41   Dg Chest Port 1 View  Result Date: 06/07/2016 CLINICAL DATA:  Acute onset of cough.  Initial encounter. EXAM: PORTABLE CHEST 1 VIEW COMPARISON:  Chest radiograph performed 05/14/2016 FINDINGS: There is complete opacification of the left hemithorax, reflecting an enlarging left-sided pleural effusion. Worsening right-sided airspace opacification raises concern for pneumonia. A small right pleural effusion is again noted. Vascular congestion is noted, and superimposed pulmonary edema is a concern. The cardiomediastinal silhouette is not well assessed due to opacification of the left hemithorax. No acute osseous abnormalities are seen. Cervical spinal fusion hardware is noted. IMPRESSION: 1. Complete opacification of the left hemithorax, reflecting an enlarging left-sided pleural effusion. Diagnostic thoracentesis could be considered for further evaluation. 2. Small right pleural effusion. Worsening right-sided airspace opacification raises concern for pneumonia. 3. Vascular congestion noted. Superimposed pulmonary edema is a concern. Electronically Signed   By: Roanna RaiderJeffery  Chang M.D.   On: 06/07/2016 00:49     ASSESSMENT AND PLAN:   56 year old male with past medical history of alcohol abuse, alcoholic liver disease with cirrhosis, chronic anemia, diabetes, hepatitis C, depression and was admitted to the hospital due to an upper GI bleed  1. GI bleed-this was the cause of patient's worsening anemia. Patient had a upper GI bleed secondary to esophageal varices. -Seen by gastroenterology and status post banding of the esophageal varices.  -Hemoglobin has dropped to below 6 yesterday and transfuse 2 more units and hemoglobin up over 8 today. Continue Protonix, appreciate gastroenterology input and no further  intervention and continue supportive care.  2. Acute respiratory failure with hypoxia-secondary to pleural effusions and anasarca.  -Remains intubated on 60% FiO2. Status post  right-sided thoracentesis yesterday with 2 L of fluid removed. Continue weaning as per intensivist.  3. Acute blood loss anemia-secondary to the GI bleed. Transfused 2 units yesterday and Hg. Up from 5.6 to 8.7 -We'll follow hemoglobin. Transfuse if hemoglobin less than 7.  4. Altered mental status-now intubated and sedated due to respiratory failure. -We'll follow mental status once extubated.  5. Acute kidney injury-patient is acute on chronic renal failure. -As per nephrology baseline creatinine around 1.2. Now up to 2.4. Differential is hepatorenal (vs) ATN (vs) hypovolemic  (vs) interstitial nephritis. -Poor candidate for dialysis. Prognosis poor.   6. Hypotension - etiology unclear. ?? Sepsis but no clear source identified.  - MRSA PCR (-) and off Vanc. Now and cont. Zosyn. Cultures so far (-).  - cont. IV Levophed, empiric abx. Follow cutlures.   Overall prognosis Is very poor given multiple comorbidities and chronic liver disease. Intensivist had long discussion with Pt's wife about poor prognosis and they are leaning towards comfort care and terminal extubation but awaiting further family to arrive.  Pt. Is a DNR now.    All the records are reviewed and case discussed with Care Management/Social Worker. Management plans discussed with the patient, family and they are in agreement.  CODE STATUS: DNR  DVT Prophylaxis: Teds and SCDs   TOTAL TIME TAKING CARE OF THIS PATIENT: 30  minutes.   POSSIBLE D/C unclear , DEPENDING ON CLINICAL CONDITION.   Houston Siren M.D on 06/08/2016 at 1:28 PM  Between 7am to 6pm - Pager - [phone]  After 6pm go to [email or web address] - Social research officer, government  [facility]  Office  [phone]  CC: Primary care physician; Patient, No Pcp Per

## 2016-06-08 NOTE — Progress Notes (Signed)
PT Cancellation Note  Patient Details Name: Benjamin ClasHarold D Oftedahl MRN: [id number] DOB: [birth year]   Cancelled Treatment:    Reason Eval/Treat Not Completed: Medical issues which prohibited therapy (Per chart review, patient with decline in medical status and transfer to CCU.  Per policy, will require new orders to resume PT services if/when medically appropriate.  Please reconsult as goals of care dictate.)   Santia Labate H. Manson PasseyBrown, PT, DPT, NCS 06/08/16, 9:55 PM [phone]

## 2016-06-08 NOTE — Progress Notes (Signed)
Subjective:  Patient remains critically ill and is doing poorly. Repeat chest x-ray shows that the pleural effusions have reoccurred. Patient remains on pressors and sedation Continued on ventilator support Serum creatinine is slightly worse from 2.56  To 2.66 Urine output greater than 1000 cc   Current medications: Current Facility-Administered Medications  Medication Dose Route Frequency Provider Last Rate Last Dose  . 0.9 %  sodium chloride infusion  250 mL Intravenous PRN Hugelmeyer, Alexis, DO      . 0.9 %  sodium chloride infusion  10 mL/hr Intravenous Once Shane Crutch, MD      . 0.9 %  sodium chloride infusion   Intravenous Once Houston Siren, MD      . albumin human 25 % solution 25 g  25 g Intravenous Q6H Katharina Caper, MD   25 g at 06/08/16 0511  . bisacodyl (DULCOLAX) EC tablet 5 mg  5 mg Oral Daily PRN Hugelmeyer, Alexis, DO      . chlorhexidine gluconate (MEDLINE KIT) (PERIDEX) 0.12 % solution 15 mL  15 mL Mouth Rinse BID Varughese, Bincy S, NP   15 mL at 06/08/16 0928  . fentaNYL (SUBLIMAZE) injection 50-100 mcg  50-100 mcg Intravenous Q1H PRN Shane Crutch, MD   100 mcg at 06/07/16 1108  . fentaNYL in NS (47mcg/ml) infusion-PREMIX  0-400 mcg/hr Intravenous Continuous Shane Crutch, MD 10 mL/hr at 06/08/16 0822 100 mcg/hr at 06/08/16 9222  . HYDROmorphone (DILAUDID) injection 0.5-1 mg  0.5-1 mg Intravenous Q3H PRN Alita Chyle, NP   1 mg at 06/06/16 2211  . insulin aspart (novoLOG) injection 0-9 Units  0-9 Units Subcutaneous Q4H Hugelmeyer, Alexis, DO   1 Units at 06/08/16 0929  . ipratropium-albuterol (DUONEB) 0.5-2.5 (3) MG/3ML nebulizer solution 3 mL  3 mL Nebulization Q4H PRN Oralia Manis, MD   3 mL at 06/07/16 0253  . loratadine (CLARITIN) tablet 10 mg  10 mg Oral Daily Hugelmeyer, Alexis, DO   10 mg at 06/08/16 0931  . magnesium citrate solution 1 Bottle  1 Bottle Oral Once PRN Hugelmeyer, Alexis, DO      . MEDLINE mouth  rinse  15 mL Mouth Rinse 10 times per day Bennett Scrape S, NP   15 mL at 06/08/16 0635  . meperidine (DEMEROL) tablet 50 mg  50 mg Oral Q4H PRN Hugelmeyer, Alexis, DO      . meropenem (MERREM) 1 g in sodium chloride 0.9 % 100 mL IVPB  1 g Intravenous Q12H Oralia Manis, MD   Stopped at 06/08/16 0413  . midodrine (PROAMATINE) tablet 15 mg  15 mg Oral TID WC Hugelmeyer, Alexis, DO   15 mg at 06/08/16 0935  . norepinephrine (LEVOPHED) 4 mg in dextrose 5 % 250 mL (0.016 mg/mL) infusion  0-40 mcg/min Intravenous Titrated Zigmund Gottron, MD 60 mL/hr at 06/08/16 1046 16 mcg/min at 06/08/16 1046  . ondansetron (ZOFRAN) tablet 4 mg  4 mg Oral Q4H PRN Varughese, Bincy S, NP       Or  . ondansetron (ZOFRAN) injection 4 mg  4 mg Intravenous Q4H PRN Varughese, Bincy S, NP   4 mg at 06/05/16 0015  . pantoprazole (PROTONIX) injection 40 mg  40 mg Intravenous Q12H Hugelmeyer, Alexis, DO   40 mg at 06/08/16 0931  . phenol (CHLORASEPTIC) mouth spray 2 spray  2 spray Mouth/Throat PRN Katharina Caper, MD   2 spray at 06/05/16 2234  . promethazine (PHENERGAN) tablet 25 mg  25 mg Oral Q6H PRN  Laverle Hobby, MD      . senna-docusate (Senokot-S) tablet 1 tablet  1 tablet Oral QHS PRN Hugelmeyer, Alexis, DO      . sodium bicarbonate tablet 1,300 mg  1,300 mg Oral QID Hugelmeyer, Alexis, DO   1,300 mg at 06/08/16 0930  . sodium chloride flush (NS) 0.9 % injection 3 mL  3 mL Intravenous Q12H Hugelmeyer, Alexis, DO   3 mL at 06/08/16 0938  . sodium chloride flush (NS) 0.9 % injection 3 mL  3 mL Intravenous PRN Hugelmeyer, Alexis, DO      . vitamin E capsule 400-800 Units  400-800 Units Oral Daily Hugelmeyer, Alexis, DO   400 Units at 06/05/16 1000  . zolpidem (AMBIEN) tablet 5 mg  5 mg Oral QHS PRN,MR X 1 Hugelmeyer, Alexis, DO            Vital Signs: Blood pressure 122/74, pulse 81, temperature 97.7 F (36.5 C), resp. rate 16, height '5\' 11"'$  (1.803 m), weight 59.2 kg (130 lb 8.2 oz), SpO2 100  %.   Intake/Output Summary (Last 24 hours) at 06/08/16 1048 Last data filed at 06/08/16 0300  Gross per 24 hour  Intake          3243.54 ml  Output             1410 ml  Net          1833.54 ml    Weight trends: Filed Weights   06/02/2016 1654 06/01/2016 0022  Weight: 50.3 kg (111 lb) 59.2 kg (130 lb 8.2 oz)    Physical Exam: General: Critically ill appearing, cachectic  HEENT Sunken eyes, temporal wasting, ETT in place  Neck:  supple  Lungs: Coarse b/l, Vent assisted  Heart:: Irregular, tachycardic  Abdomen: Soft, distended, umblicus everted  Extremities:  + dependent edema, cool to touch     Skin: Lacy rash over legs, scattered eccymosis     Foley: In place       Lab results: Basic Metabolic Panel:  Recent Labs Lab 06/07/16 0356 06/07/16 2222 06/08/16 0613  NA 142 141 141  K 5.1 4.4 4.3  CL 113* 112* 109  CO2 17* 20* 23  GLUCOSE 170* 109* 146*  BUN 57* 59* 60*  CREATININE 2.44* 2.56* 2.66*  CALCIUM 9.2 8.7* 8.2*  MG  --  2.0 1.9  PHOS  --  5.3*  --     Liver Function Tests:  Recent Labs Lab 06/05/16 0313  AST 31  ALT 16*  ALKPHOS 46  BILITOT 1.6*  PROT 5.6*  ALBUMIN 3.3*    Recent Labs Lab 05/14/2016 1706  LIPASE 34    Recent Labs Lab 05/06/2016 1421  AMMONIA 24    CBC:  Recent Labs Lab 06/02/16 1800 05/24/2016 1706  06/07/16 2222 06/08/16 0613  WBC 8.8 6.8  < > 11.8* 17.9*  NEUTROABS 6.0 4.5  --   --   --   HGB 9.2* 6.5*  < > 7.7* 8.7*  HCT 26.3* 18.8*  < > 23.2* 25.3*  MCV 103.0* 105.0*  < > 96.4 92.3  PLT 201 163  < > 175 122*  < > = values in this interval not displayed.  Cardiac Enzymes:  Recent Labs Lab 06/03/2016 1342  TROPONINI <0.03    BNP: Invalid input(s): POCBNP  CBG:  Recent Labs Lab 06/07/16 1600 06/07/16 1949 06/07/16 2339 06/08/16 0337 06/08/16 0738  GLUCAP 92 94 127* 138* 131*    Microbiology: Recent Results (from the past 720  hour(s))  MRSA PCR Screening     Status: None   Collection Time:  05/18/2016 12:10 AM  Result Value Ref Range Status   MRSA by PCR NEGATIVE NEGATIVE Final    Comment:        The GeneXpert MRSA Assay (FDA approved for NASAL specimens only), is one component of a comprehensive MRSA colonization surveillance program. It is not intended to diagnose MRSA infection nor to guide or monitor treatment for MRSA infections.   Body fluid culture     Status: None (Preliminary result)   Collection Time: 06/07/16 10:08 AM  Result Value Ref Range Status   Specimen Description PLEURAL  Final   Special Requests NONE  Final   Gram Stain   Final    RARE WBC PRESENT, PREDOMINANTLY MONONUCLEAR NO ORGANISMS SEEN    Culture   Final    NO GROWTH < 24 HOURS Performed at [facility], [place] [address] [place], [place] [address]    Report Status PENDING  Incomplete     Coagulation Studies:  Recent Labs  06/06/16 0452 06/07/16 1050 06/07/16 2224  LABPROT 16.6* 18.9* 20.6*  INR 1.33 1.57 1.74    Urinalysis:  Recent Labs  06/06/16 1649  COLORURINE YELLOW*  LABSPEC 1.013  PHURINE 5.0  GLUCOSEU NEGATIVE  HGBUR NEGATIVE  BILIRUBINUR NEGATIVE  KETONESUR 5*  PROTEINUR NEGATIVE  NITRITE NEGATIVE  LEUKOCYTESUR NEGATIVE        Imaging: Dg Chest 1 View  Result Date: 06/08/2016 CLINICAL DATA:  Shortness of breath.  Status post thoracentesis. EXAM: CHEST 1 VIEW COMPARISON:  06/07/2016 and prior exams FINDINGS: Cardiomediastinal silhouette is unchanged. An endotracheal tube with tip 4.5 cm above carina, left IJ central venous catheter with tip overlying the SVC, and NG tube entering the stomach with tip off the field of view again noted. Diffuse bilateral airspace opacities/edema, right greater than left, again noted. Bilateral pleural effusions, right greater than left are unchanged. An equivocal small left apical pneumothorax is noted but decreased in size from prior study. No other changes noted. IMPRESSION: Equivocal small left apical pneumothorax which  is decreased in size from the prior study. No other significant changes. Electronically Signed   By: Margarette Canada M.D.   On: 06/08/2016 07:07   Dg Chest 1 View  Addendum Date: 06/07/2016   ADDENDUM REPORT: 06/07/2016 17:16 ADDENDUM: Correction on the clinical data. This chest x-ray performed following left thoracentesis. Decreasing left pleural effusion noted as described on prior report. No pneumothorax. Electronically Signed   By: Rolm Baptise M.D.   On: 06/07/2016 17:16   Result Date: 06/07/2016 CLINICAL DATA:  Status post right chest thoracentesis today. EXAM: CHEST 1 VIEW COMPARISON:  Chest x-rays from earlier same day and chest x-ray dated 06/04/2016. FINDINGS: There is improved aeration of the left lung compared to the most recent chest x-ray from earlier today, previously moderate to large left pleural effusion. Veiled opacity at the mid and lower zones of the left lung suggests some persistent layering pleural effusion. There is significantly increasing opacification of the right hemithorax, presumably pleural effusion and/or airspace collapse. Endotracheal tube appears stable in position with tip approximately 3.5 cm above the carina. Enteric tube passes below the diaphragm. Heart size and mediastinal contours are grossly stable, partially obscured by the airspace opacities. IMPRESSION: 1. SIGNIFICANTLY INCREASED OPACIFICATION of the RIGHT hemithorax, presumably large pleural effusion and/or airspace collapse. 2. Significantly improved aeration of the left lung, suspect small amount of persistent layering pleural effusion. 3.  Support apparatus appears appropriately positioned. These results will be called to the ordering clinician or representative by the Radiologist Assistant, and communication documented in the PACS or zVision Dashboard. Electronically Signed: By: Franki Cabot M.D. On: 06/07/2016 11:00   Dg Abd 1 View  Result Date: 06/07/2016 CLINICAL DATA:  Orogastric tube placement.  Initial  encounter. EXAM: ABDOMEN - 1 VIEW COMPARISON:  CT of the abdomen and pelvis performed 02/07/2004 FINDINGS: The patient's enteric tube is noted ending overlying the body of the stomach. The stomach is largely filled with air. The visualized bowel gas pattern is grossly unremarkable. No free intra-abdominal air is seen, though evaluation for free air is limited on a single supine view. No acute osseous abnormalities are seen. Small right and moderate to large left-sided pleural effusions are seen. IMPRESSION: 1. Enteric tube noted ending overlying the body of the stomach. 2. Small right and moderate to large left-sided pleural effusions noted. Electronically Signed   By: Garald Balding M.D.   On: 06/07/2016 06:43   US Renal  Result Date: 06/06/2016 CLINICAL DATA:  Renal failure EXAM: RENAL / URINARY TRACT ULTRASOUND COMPLETE COMPARISON:  CT abdomen and pelvis February 07, 2004 FINDINGS: Right Kidney: Length: 10.7 cm. Echogenicity is diffusely increased. The renal cortical thickness is normal. No mass, perinephric fluid, or hydronephrosis visualized. No sonographically demonstrable calculus or ureterectasis. Left Kidney: Length: 9.8 cm. Echogenicity is diffusely increased. Renal cortical thickness is normal. No mass, perinephric fluid, or hydronephrosis visualized. There is no sonographically demonstrable calculus or ureterectasis. Bladder: Appears normal for degree of bladder distention. There is extensive ascites. IMPRESSION: Kidneys are echogenic bilaterally, a finding indicative of medical renal disease. No obstructing focus in either kidney. Renal cortical thickness bilaterally. Kidneys otherwise unremarkable in appearance. There is extensive ascites. Electronically Signed   By: Lowella Grip III M.D.   On: 06/06/2016 15:54   Dg Chest Port 1 View  Result Date: 06/07/2016 CLINICAL DATA:  Central line placement.  Initial encounter. EXAM: PORTABLE CHEST 1 VIEW COMPARISON:  Chest radiograph performed earlier  today at 10:40 a.m. FINDINGS: The patient's endotracheal tube is seen ending 6 cm above the carina. An enteric tube is seen extending below the diaphragm. The patient's left IJ line is noted ending about the mid SVC. Improving diffuse right-sided airspace opacification is noted, with an underlying small right pleural effusion. Underlying vascular congestion is noted, with hazy central opacity, raising concern for interstitial edema. A small left pleural effusion is also noted. No pneumothorax is seen. The cardiomediastinal silhouette is normal in size. No acute osseous abnormalities are seen. A linear lucency overlying the left hemithorax appears to reflect a skin fold. IMPRESSION: 1. Left IJ line noted ending about the mid SVC. 2. Endotracheal tube seen ending 6 cm above the carina. 3. Improving diffuse right-sided airspace opacification, with underlying small right pleural effusion, concerning for pneumonia. 4. Underlying vascular congestion, with hazy central opacities, concerning for superimposed interstitial edema. Small left pleural effusion noted. Electronically Signed   By: Garald Balding M.D.   On: 06/07/2016 22:43   Dg Chest Port 1 View  Result Date: 06/07/2016 CLINICAL DATA:  Endotracheal tube placement.  Initial encounter. EXAM: PORTABLE CHEST 1 VIEW COMPARISON:  Chest radiograph performed earlier today at 12:37 a.m. FINDINGS: The patient's endotracheal tube is seen ending 4-5 cm above the carina. An enteric tube is seen extending below the diaphragm. Small right and moderate to large left pleural effusions are noted, improved on the left from the prior study.  Underlying airspace opacification may reflect pulmonary edema or possibly pneumonia. The cardiomediastinal silhouette is normal in size. No acute osseous abnormalities are identified. IMPRESSION: 1. Endotracheal tube seen ending 4-5 cm above the carina. 2. Small right and moderate to large left pleural effusions, improved on the left from the  recent prior study, though this may simply reflect improved lung expansion. Underlying airspace opacification may reflect pulmonary edema or possibly pneumonia. Electronically Signed   By: Garald Balding M.D.   On: 06/07/2016 06:41   Dg Chest Port 1 View  Result Date: 06/07/2016 CLINICAL DATA:  Acute onset of cough.  Initial encounter. EXAM: PORTABLE CHEST 1 VIEW COMPARISON:  Chest radiograph performed 05/16/2016 FINDINGS: There is complete opacification of the left hemithorax, reflecting an enlarging left-sided pleural effusion. Worsening right-sided airspace opacification raises concern for pneumonia. A small right pleural effusion is again noted. Vascular congestion is noted, and superimposed pulmonary edema is a concern. The cardiomediastinal silhouette is not well assessed due to opacification of the left hemithorax. No acute osseous abnormalities are seen. Cervical spinal fusion hardware is noted. IMPRESSION: 1. Complete opacification of the left hemithorax, reflecting an enlarging left-sided pleural effusion. Diagnostic thoracentesis could be considered for further evaluation. 2. Small right pleural effusion. Worsening right-sided airspace opacification raises concern for pneumonia. 3. Vascular congestion noted. Superimposed pulmonary edema is a concern. Electronically Signed   By: Garald Balding M.D.   On: 06/07/2016 00:49     Assessment & Plan: Pt is a 56 y.o. caucasian  male with a PMHX of significant alcohol abuse until 2015, Hep C related cirrhosis, h/o treatment with interferon (failed),DM-2 , was admitted on 06/02/2016 with upper GI bleed  1. ARF on chronic renal failure st 3 - S Creatinine has been steadily worsening over the past 2 months - Baseline 1.28 in April - Admit Cr 2.18 to 2.66 today Differential includes hepatorenal syndrome, ATN, hypovolemia, interstitial nephritis - urine P/C ratio is 0.49    2. Acute resp failure - vent assisted at present - Thoracentisis 06/07/16.  3.  Cirrhosis with sequelae - ascites - patient receiving iv albumin, midodrine. Iv lasix and octreotide  Overall poor prognosis. Patient's family is leaning towards comfort care.

## 2016-06-08 NOTE — Progress Notes (Signed)
Patient in bed.  Wife, mother and family at bedside.  Medications tolerated. Patient calm at this time.

## 2016-06-08 NOTE — Progress Notes (Signed)
Benjamin Lame, MD [facility]   [address], [place], [place] [address] Phone: [phone] Fax : [phone]   Subjective: The patient was brought back to the ICU for respiratory distress. The patient had a thoracentesis with a large volume of fluid taken off his long. The patient has remained intubated and dropped his hemoglobin down to 5.6. The patient's hemoglobin yesterday was 7.7 is 8.7 today. Patient has end-stage liver disease and multiple bands placed on esophageal varices by Dr. Vicente Males. It was recommended that if the patient had any further GI bleeding that a TIPSS procedure be done since repeat endoscopy is unlikely to be helpful. The patient is now nonresponsive and critically L.   Objective: Vital signs in last 24 hours: Vitals:   06/08/16 0745 06/08/16 0800 06/08/16 0900 06/08/16 1000  BP: 107/70 122/74 103/63 111/75  Pulse: 77 81 87 80  Resp: '18 16 18 16  '$ Temp: 73.4 F (36.5 C) 97.7 F (36.5 C) 97.9 F (36.6 C) 98.1 F (36.7 C)  TempSrc:      SpO2: 100% 100% 96% 98%  Weight:      Height:       Weight change:   Intake/Output Summary (Last 24 hours) at 06/08/16 1203 Last data filed at 06/08/16 2876  Gross per 24 hour  Intake          3243.54 ml  Output               60 ml  Net          3183.54 ml     Exam: Heart:: Regular rate and rhythm, S1S2 present or without murmur or extra heart sounds Lungs: normal and clear to auscultation and percussion Abdomen: soft, nontender, normal bowel sounds   Lab Results: '@LABTEST2'$ @ Micro Results: Recent Results (from the past 240 hour(s))  MRSA PCR Screening     Status: None   Collection Time: 05/23/2016 12:10 AM  Result Value Ref Range Status   MRSA by PCR NEGATIVE NEGATIVE Final    Comment:        The GeneXpert MRSA Assay (FDA approved for NASAL specimens only), is one component of a comprehensive MRSA colonization surveillance program. It is not intended to diagnose MRSA infection nor to guide or monitor  treatment for MRSA infections.   Body fluid culture     Status: None (Preliminary result)   Collection Time: 06/07/16 10:08 AM  Result Value Ref Range Status   Specimen Description PLEURAL  Final   Special Requests NONE  Final   Gram Stain   Final    RARE WBC PRESENT, PREDOMINANTLY MONONUCLEAR NO ORGANISMS SEEN    Culture   Final    NO GROWTH < 24 HOURS Performed at [facility], [place] [address], [place], [place] [address]    Report Status PENDING  Incomplete   Studies/Results: Dg Chest 1 View  Result Date: 06/08/2016 CLINICAL DATA:  Shortness of breath.  Status post thoracentesis. EXAM: CHEST 1 VIEW COMPARISON:  06/07/2016 and prior exams FINDINGS: Cardiomediastinal silhouette is unchanged. An endotracheal tube with tip 4.5 cm above carina, left IJ central venous catheter with tip overlying the SVC, and NG tube entering the stomach with tip off the field of view again noted. Diffuse bilateral airspace opacities/edema, right greater than left, again noted. Bilateral pleural effusions, right greater than left are unchanged. An equivocal small left apical pneumothorax is noted but decreased in size from prior study. No other changes noted. IMPRESSION: Equivocal small left  apical pneumothorax which is decreased in size from the prior study. No other significant changes. Electronically Signed   By: Margarette Canada M.D.   On: 06/08/2016 07:07   Dg Chest 1 View  Addendum Date: 06/07/2016   ADDENDUM REPORT: 06/07/2016 17:16 ADDENDUM: Correction on the clinical data. This chest x-ray performed following left thoracentesis. Decreasing left pleural effusion noted as described on prior report. No pneumothorax. Electronically Signed   By: Rolm Baptise M.D.   On: 06/07/2016 17:16   Result Date: 06/07/2016 CLINICAL DATA:  Status post right chest thoracentesis today. EXAM: CHEST 1 VIEW COMPARISON:  Chest x-rays from earlier same day and chest x-ray dated 05/28/2016. FINDINGS: There is improved aeration of  the left lung compared to the most recent chest x-ray from earlier today, previously moderate to large left pleural effusion. Veiled opacity at the mid and lower zones of the left lung suggests some persistent layering pleural effusion. There is significantly increasing opacification of the right hemithorax, presumably pleural effusion and/or airspace collapse. Endotracheal tube appears stable in position with tip approximately 3.5 cm above the carina. Enteric tube passes below the diaphragm. Heart size and mediastinal contours are grossly stable, partially obscured by the airspace opacities. IMPRESSION: 1. SIGNIFICANTLY INCREASED OPACIFICATION of the RIGHT hemithorax, presumably large pleural effusion and/or airspace collapse. 2. Significantly improved aeration of the left lung, suspect small amount of persistent layering pleural effusion. 3. Support apparatus appears appropriately positioned. These results will be called to the ordering clinician or representative by the Radiologist Assistant, and communication documented in the PACS or zVision Dashboard. Electronically Signed: By: Franki Cabot M.D. On: 06/07/2016 11:00   Dg Abd 1 View  Result Date: 06/07/2016 CLINICAL DATA:  Orogastric tube placement.  Initial encounter. EXAM: ABDOMEN - 1 VIEW COMPARISON:  CT of the abdomen and pelvis performed 02/07/2004 FINDINGS: The patient's enteric tube is noted ending overlying the body of the stomach. The stomach is largely filled with air. The visualized bowel gas pattern is grossly unremarkable. No free intra-abdominal air is seen, though evaluation for free air is limited on a single supine view. No acute osseous abnormalities are seen. Small right and moderate to large left-sided pleural effusions are seen. IMPRESSION: 1. Enteric tube noted ending overlying the body of the stomach. 2. Small right and moderate to large left-sided pleural effusions noted. Electronically Signed   By: Garald Balding M.D.   On:  06/07/2016 06:43   US Renal  Result Date: 06/06/2016 CLINICAL DATA:  Renal failure EXAM: RENAL / URINARY TRACT ULTRASOUND COMPLETE COMPARISON:  CT abdomen and pelvis February 07, 2004 FINDINGS: Right Kidney: Length: 10.7 cm. Echogenicity is diffusely increased. The renal cortical thickness is normal. No mass, perinephric fluid, or hydronephrosis visualized. No sonographically demonstrable calculus or ureterectasis. Left Kidney: Length: 9.8 cm. Echogenicity is diffusely increased. Renal cortical thickness is normal. No mass, perinephric fluid, or hydronephrosis visualized. There is no sonographically demonstrable calculus or ureterectasis. Bladder: Appears normal for degree of bladder distention. There is extensive ascites. IMPRESSION: Kidneys are echogenic bilaterally, a finding indicative of medical renal disease. No obstructing focus in either kidney. Renal cortical thickness bilaterally. Kidneys otherwise unremarkable in appearance. There is extensive ascites. Electronically Signed   By: Lowella Grip III M.D.   On: 06/06/2016 15:54   Dg Chest Port 1 View  Result Date: 06/07/2016 CLINICAL DATA:  Central line placement.  Initial encounter. EXAM: PORTABLE CHEST 1 VIEW COMPARISON:  Chest radiograph performed earlier today at 10:40 a.m. FINDINGS: The patient's  endotracheal tube is seen ending 6 cm above the carina. An enteric tube is seen extending below the diaphragm. The patient's left IJ line is noted ending about the mid SVC. Improving diffuse right-sided airspace opacification is noted, with an underlying small right pleural effusion. Underlying vascular congestion is noted, with hazy central opacity, raising concern for interstitial edema. A small left pleural effusion is also noted. No pneumothorax is seen. The cardiomediastinal silhouette is normal in size. No acute osseous abnormalities are seen. A linear lucency overlying the left hemithorax appears to reflect a skin fold. IMPRESSION: 1. Left IJ  line noted ending about the mid SVC. 2. Endotracheal tube seen ending 6 cm above the carina. 3. Improving diffuse right-sided airspace opacification, with underlying small right pleural effusion, concerning for pneumonia. 4. Underlying vascular congestion, with hazy central opacities, concerning for superimposed interstitial edema. Small left pleural effusion noted. Electronically Signed   By: Garald Balding M.D.   On: 06/07/2016 22:43   Dg Chest Port 1 View  Result Date: 06/07/2016 CLINICAL DATA:  Endotracheal tube placement.  Initial encounter. EXAM: PORTABLE CHEST 1 VIEW COMPARISON:  Chest radiograph performed earlier today at 12:37 a.m. FINDINGS: The patient's endotracheal tube is seen ending 4-5 cm above the carina. An enteric tube is seen extending below the diaphragm. Small right and moderate to large left pleural effusions are noted, improved on the left from the prior study. Underlying airspace opacification may reflect pulmonary edema or possibly pneumonia. The cardiomediastinal silhouette is normal in size. No acute osseous abnormalities are identified. IMPRESSION: 1. Endotracheal tube seen ending 4-5 cm above the carina. 2. Small right and moderate to large left pleural effusions, improved on the left from the recent prior study, though this may simply reflect improved lung expansion. Underlying airspace opacification may reflect pulmonary edema or possibly pneumonia. Electronically Signed   By: Garald Balding M.D.   On: 06/07/2016 06:41   Dg Chest Port 1 View  Result Date: 06/07/2016 CLINICAL DATA:  Acute onset of cough.  Initial encounter. EXAM: PORTABLE CHEST 1 VIEW COMPARISON:  Chest radiograph performed 05/25/2016 FINDINGS: There is complete opacification of the left hemithorax, reflecting an enlarging left-sided pleural effusion. Worsening right-sided airspace opacification raises concern for pneumonia. A small right pleural effusion is again noted. Vascular congestion is noted, and  superimposed pulmonary edema is a concern. The cardiomediastinal silhouette is not well assessed due to opacification of the left hemithorax. No acute osseous abnormalities are seen. Cervical spinal fusion hardware is noted. IMPRESSION: 1. Complete opacification of the left hemithorax, reflecting an enlarging left-sided pleural effusion. Diagnostic thoracentesis could be considered for further evaluation. 2. Small right pleural effusion. Worsening right-sided airspace opacification raises concern for pneumonia. 3. Vascular congestion noted. Superimposed pulmonary edema is a concern. Electronically Signed   By: Garald Balding M.D.   On: 06/07/2016 00:49   Medications: I have reviewed the patient's current medications. Scheduled Meds: . chlorhexidine gluconate (MEDLINE KIT)  15 mL Mouth Rinse BID  . insulin aspart  0-9 Units Subcutaneous Q4H  . loratadine  10 mg Oral Daily  . mouth rinse  15 mL Mouth Rinse 10 times per day  . midodrine  15 mg Oral TID WC  . pantoprazole (PROTONIX) IV  40 mg Intravenous Q12H  . sodium bicarbonate  1,300 mg Oral QID  . sodium chloride flush  3 mL Intravenous Q12H  . vitamin E  400-800 Units Oral Daily   Continuous Infusions: . sodium chloride    . sodium chloride    .  sodium chloride    . albumin human    . fentaNYL infusion INTRAVENOUS 100 mcg/hr (06/08/16 0822)  . meropenem (MERREM) IV Stopped (06/08/16 0413)  . norepinephrine (LEVOPHED) Adult infusion 16 mcg/min (06/08/16 1046)   PRN Meds:.sodium chloride, bisacodyl, fentaNYL (SUBLIMAZE) injection, HYDROmorphone (DILAUDID) injection, ipratropium-albuterol, magnesium citrate, meperidine, ondansetron **OR** ondansetron (ZOFRAN) IV, phenol, promethazine, senna-docusate, sodium chloride flush, zolpidem   Assessment: Active Problems:   Upper GI bleed   Acute GI bleeding   Cirrhosis of liver without ascites (HCC)   Coagulopathy (HCC)   Pleural effusion associated with hepatic disorder   Palliative care by  specialist   Goals of care, counseling/discussion   Acute renal failure ([place])   S/P thoracentesis    Plan: This patient has end-stage liver disease and a very poor prognosis with recurrent GI bleeding and esophageal varices. The patient should be treated symptomatically with blood transfusions as needed. Nothing much more to add from a GI point of view.   LOS: 5 days   Benjamin Bradley 06/08/2016, 12:03 PM

## 2016-06-08 NOTE — Progress Notes (Signed)
Name: Benjamin Bradley MRN: [id number] DOB: 1960/03/19    ADMISSION DATE:  06/01/2016 CONSULTATION DATE:  06/05/2016   SUBJECTIVE:  Patient is lethargic and on the ventilator. Review of systems cannot be obtained given critical illness.  REVIEW OF SYSTEMS:   Could not be obtained.   VITAL SIGNS: Temp:  [95.5 F (35.3 C)-99.1 F (37.3 C)] 98.1 F (36.7 C) (06/03 1300) Pulse Rate:  [76-104] 82 (06/03 1300) Resp:  [12-25] 16 (06/03 1300) BP: (71-122)/(50-76) 113/72 (06/03 1300) SpO2:  [91 %-100 %] 92 % (06/03 1300) FiO2 (%):  [40 %-65 %] 40 % (06/03 0842)  PHYSICAL EXAMINATION: General:  Pale cachectic appearing,  Neuro:  Awake, Alert and Oriented, but not wanting to talk HEENT:  AT.[place],PERRLA, Cardiovascular:  S1S2,Regular,no m/r/g noted Lungs:  Diminished air entry, no wheezes,crackles, rhonchi noted Abdomen:  Firm , Non tender, nondistended Musculoskeletal:  No edema, cyanosis noted Skin:  Warm.dry and intact.   Recent Labs Lab 06/07/16 0356 06/07/16 2222 06/08/16 0613  NA 142 141 141  K 5.1 4.4 4.3  CL 113* 112* 109  CO2 17* 20* 23  BUN 57* 59* 60*  CREATININE 2.44* 2.56* 2.66*  GLUCOSE 170* 109* 146*    Recent Labs Lab 06/06/16 0452  06/07/16 1633 06/07/16 2222 06/08/16 0613  HGB 9.1*  < > 5.6* 7.7* 8.7*  HCT 26.4*  --   --  23.2* 25.3*  WBC 10.5  --   --  11.8* 17.9*  PLT 148*  --   --  175 122*  < > = values in this interval not displayed.   ASSESSMENT / PLAN:   Large left sided pleural effusion, now increased in size with mediastinal shift to the right, complicated by acute respiratory failure. I personally reviewed the patient's chest x-rays which shows a large left-sided pleural effusion with a rightward mediastinal shift, there is also a small right-sided pleural effusion with some  pulmonary edema, these have led to acute hypoxic respiratory failure, vent dependent.  Left pleural effusion appears to be reaccumulating.   Small left apical  ptx, decreasing in size.   Acute kidney injury.   GI bleed in the setting of End Stage Cirrhosis, status post EGD with findings of  bleeding esophageal varices, status post banding. Continues to have maroon stools, however hemoglobins appear to be stable.  MSSA (methicillin susceptible Staphylococcus aureus) infection   Acute Blood Loss Anemia  Acute on Chronic Kidney disease   End Stage Cirrhosis  Abdominal Pain   Plan Wean down vent support as tolerated. Monitor hemoglobin. Transfusion of PRBC as needed. Discussed with wife at bedside, patient appears to have persistent pleural effusions that are now reaccumulating, status post drainage. The patient has been consistently receiving albumin with persistently low blood pressure. This is made weaning very difficult, and the patient will likely require long-term ventilation. Given this, I discussed with the patient's wife regarding goals of care. We decided that the patient's CODE STATUS will be changed to DO NOT RESUSCITATE. We would consider change to comfort measures. [place] after patient's further family members arrive.Wells Guiles.   -Deep Lesle Faron, M.D. 06/08/2016   Critical Care Attestation.  I have personally obtained a history, examined the patient, evaluated laboratory and imaging results, formulated the assessment and plan and placed orders. The Patient requires high complexity decision making for assessment and support, frequent evaluation and titration of therapies, application of advanced monitoring technologies and extensive interpretation of multiple databases. The patient has critical illness that could lead imminently  to failure of 1 or more organ systems and requires the highest level of physician preparedness to intervene.  Critical Care Time devoted to patient care services described in this note is 45 minutes and is exclusive of time spent in procedures.

## 2016-06-09 DIAGNOSIS — J9601 Acute respiratory failure with hypoxia: Secondary | ICD-10-CM

## 2016-06-09 DIAGNOSIS — B182 Chronic viral hepatitis C: Secondary | ICD-10-CM

## 2016-06-09 LAB — GLUCOSE, CAPILLARY
GLUCOSE-CAPILLARY: 154 mg/dL — AB (ref 65–99)
Glucose-Capillary: 157 mg/dL — ABNORMAL HIGH (ref 65–99)
Glucose-Capillary: 148 mg/dL — ABNORMAL HIGH (ref 65–99)
Glucose-Capillary: 138 mg/dL — ABNORMAL HIGH (ref 65–99)

## 2016-06-09 LAB — BPAM RBC
BLOOD PRODUCT EXPIRATION DATE: 201806212359
Blood Product Expiration Date: 201806212359
ISSUE DATE / TIME: 201806022308
ISSUE DATE / TIME: 201806030214
UNIT TYPE AND RH: 5100
UNIT TYPE AND RH: 5100

## 2016-06-09 LAB — CBC
HCT: 20 % — ABNORMAL LOW (ref 40.0–52.0)
Hemoglobin: 6.7 g/dL — ABNORMAL LOW (ref 13.0–18.0)
MCH: 31.2 pg (ref 26.0–34.0)
MCHC: 33.3 g/dL (ref 32.0–36.0)
MCV: 93.8 fL (ref 80.0–100.0)
Platelets: 134 10*3/uL — ABNORMAL LOW (ref 150–440)
RBC: 2.14 MIL/uL — AB (ref 4.40–5.90)
RDW: 19.7 % — ABNORMAL HIGH (ref 11.5–14.5)
WBC: 23.5 10*3/uL — ABNORMAL HIGH (ref 3.8–10.6)

## 2016-06-09 LAB — COMPREHENSIVE METABOLIC PANEL
ALBUMIN: 3.4 g/dL — AB (ref 3.5–5.0)
ALK PHOS: 41 U/L (ref 38–126)
ALT: 15 U/L — ABNORMAL LOW (ref 17–63)
ANION GAP: 10 (ref 5–15)
AST: 43 U/L — ABNORMAL HIGH (ref 15–41)
BILIRUBIN TOTAL: 1.8 mg/dL — AB (ref 0.3–1.2)
BUN: 66 mg/dL — ABNORMAL HIGH (ref 6–20)
CALCIUM: 7.9 mg/dL — AB (ref 8.9–10.3)
CO2: 23 mmol/L (ref 22–32)
Chloride: 106 mmol/L (ref 101–111)
Creatinine, Ser: 2.86 mg/dL — ABNORMAL HIGH (ref 0.61–1.24)
GFR calc Af Amer: 27 mL/min — ABNORMAL LOW (ref >60)
GFR calc non Af Amer: 23 mL/min — ABNORMAL LOW (ref >60)
GLUCOSE: 183 mg/dL — AB (ref 65–99)
Potassium: 3.6 mmol/L (ref 3.5–5.1)
Sodium: 139 mmol/L (ref 135–145)
TOTAL PROTEIN: 4.9 g/dL — AB (ref 6.5–8.1)

## 2016-06-09 LAB — TYPE AND SCREEN
ABO/RH(D): O POS
ANTIBODY SCREEN: NEGATIVE
Unit division: 0
Unit division: 0

## 2016-06-09 LAB — PHOSPHORUS: Phosphorus: 4.3 mg/dL (ref 2.5–4.6)

## 2016-06-09 LAB — AMMONIA: Ammonia: 46 umol/L — ABNORMAL HIGH (ref 9–35)

## 2016-06-09 LAB — LD, BODY FLUID (OTHER): LD, Body Fluid: 63 IU/L

## 2016-06-09 LAB — PROTIME-INR
INR: 2.43
Prothrombin Time: 26.9 seconds — ABNORMAL HIGH (ref 11.4–15.2)

## 2016-06-09 LAB — MAGNESIUM: MAGNESIUM: 1.7 mg/dL (ref 1.7–2.4)

## 2016-06-09 MED ORDER — FENTANYL BOLUS VIA INFUSION
50.0000 ug | INTRAVENOUS | Status: DC | PRN
  Filled 2016-06-09: qty 200

## 2016-06-09 MED ORDER — MORPHINE BOLUS VIA INFUSION
5.0000 mg | INTRAVENOUS | Status: DC | PRN
  Filled 2016-06-09: qty 20

## 2016-06-09 MED ORDER — NOREPINEPHRINE BITARTRATE 1 MG/ML IV SOLN
0.0000 ug/min | INTRAVENOUS | Status: DC
  Administered 2016-06-09: 56 ug/min via INTRAVENOUS
  Filled 2016-06-09 (×2): qty 16

## 2016-06-09 MED ORDER — MORPHINE 100MG IN NS 100ML (1MG/ML) PREMIX INFUSION
10.0000 mg/h | INTRAVENOUS | Status: DC
  Administered 2016-06-09: 10 mg/h via INTRAVENOUS
  Filled 2016-06-09: qty 100

## 2016-06-09 MED ORDER — LORAZEPAM BOLUS VIA INFUSION
2.0000 mg | INTRAVENOUS | Status: DC | PRN
  Filled 2016-06-09: qty 5

## 2016-06-09 MED ORDER — FENTANYL 2500MCG IN NS 250ML (10MCG/ML) PREMIX INFUSION: 125.0000 ug/h | INTRAVENOUS | Status: DC

## 2016-06-09 NOTE — Progress Notes (Signed)
Subjective: Family moving towards comfort care at the moment, but awaiting additional family to arrive. Creatinine up to 2.86. Urine output was only 375 cc over the preceding 24 hours. Patient remains critically ill this time and remains on the ventilator.    Current medications: Current Facility-Administered Medications  Medication Dose Route Frequency Provider Last Rate Last Dose  . 0.9 %  sodium chloride infusion  250 mL Intravenous PRN Hugelmeyer, Alexis, DO      . 0.9 %  sodium chloride infusion  10 mL/hr Intravenous Once Laverle Hobby, MD      . 0.9 %  sodium chloride infusion   Intravenous Once Sainani, Belia Heman, MD      . bisacodyl (DULCOLAX) EC tablet 5 mg  5 mg Oral Daily PRN Hugelmeyer, Alexis, DO      . chlorhexidine gluconate (MEDLINE KIT) (PERIDEX) 0.12 % solution 15 mL  15 mL Mouth Rinse BID Varughese, Bincy S, NP   15 mL at 06/08/16 2030  . fentaNYL (SUBLIMAZE) injection 50-100 mcg  50-100 mcg Intravenous Q1H PRN Laverle Hobby, MD   50 mcg at 06/08/16 2130  . fentaNYL 251mg in NS 2554m(1033mml) infusion-PREMIX  0-400 mcg/hr Intravenous Continuous RamLaverle HobbyD 12.5 mL/hr at 06/09/16 0912 125 mcg/hr at 06/09/16 0912  . HYDROmorphone (DILAUDID) injection 0.5-1 mg  0.5-1 mg Intravenous Q3H PRN MasBasilio CairoP   1 mg at 06/06/16 2211  . insulin aspart (novoLOG) injection 0-9 Units  0-9 Units Subcutaneous Q4H Hugelmeyer, Alexis, DO   1 Units at 06/09/16 0420  . ipratropium-albuterol (DUONEB) 0.5-2.5 (3) MG/3ML nebulizer solution 3 mL  3 mL Nebulization Q4H PRN WilLance CoonD   3 mL at 06/07/16 0253  . loratadine (CLARITIN) tablet 10 mg  10 mg Oral Daily Hugelmeyer, Alexis, DO   10 mg at 06/08/16 0931  . magnesium citrate solution 1 Bottle  1 Bottle Oral Once PRN Hugelmeyer, Alexis, DO      . MEDLINE mouth rinse  15 mL Mouth Rinse 10 times per day Varughese, Bincy S, NP   15 mL at 06/09/16 0200  . meperidine (DEMEROL) tablet 50 mg  50 mg Oral  Q4H PRN Hugelmeyer, Alexis, DO      . meropenem (MERREM) 500 mg in sodium chloride 0.9 % 50 mL IVPB  500 mg Intravenous Q12H JamLarene BeachPHSioux Falls Va Medical CenterStopped at 06/09/16 0240  . midodrine (PROAMATINE) tablet 15 mg  15 mg Oral TID WC Hugelmeyer, Alexis, DO   15 mg at 06/08/16 1713  . norepinephrine (LEVOPHED) 16 mg in dextrose 5 % 250 mL (0.064 mg/mL) infusion  0-65 mcg/min Intravenous Titrated Tukov, Magadalene S, NP 57.2 mL/hr at 06/09/16 1024 61 mcg/min at 06/09/16 1024  . ondansetron (ZOFRAN) tablet 4 mg  4 mg Oral Q4H PRN Varughese, Bincy S, NP       Or  . ondansetron (ZOFRAN) injection 4 mg  4 mg Intravenous Q4H PRN Varughese, Bincy S, NP   4 mg at 06/05/16 0015  . pantoprazole (PROTONIX) injection 40 mg  40 mg Intravenous Q12H Hugelmeyer, Alexis, DO   40 mg at 06/08/16 2153  . phenol (CHLORASEPTIC) mouth spray 2 spray  2 spray Mouth/Throat PRN VaiTheodoro GristD   2 spray at 06/05/16 2234  . promethazine (PHENERGAN) tablet 25 mg  25 mg Oral Q6H PRN RamLaverle HobbyD      . senna-docusate (Senokot-S) tablet 1 tablet  1 tablet Oral QHS PRN Hugelmeyer, Alexis, DO      .  sodium bicarbonate tablet 1,300 mg  1,300 mg Oral QID Hugelmeyer, Alexis, DO   1,300 mg at 06/08/16 2153  . sodium chloride flush (NS) 0.9 % injection 10-40 mL  10-40 mL Intracatheter Q12H Henreitta Leber, MD   10 mL at 06/08/16 2155  . sodium chloride flush (NS) 0.9 % injection 10-40 mL  10-40 mL Intracatheter PRN Henreitta Leber, MD      . sodium chloride flush (NS) 0.9 % injection 3 mL  3 mL Intravenous Q12H Hugelmeyer, Alexis, DO   3 mL at 06/08/16 2155  . sodium chloride flush (NS) 0.9 % injection 3 mL  3 mL Intravenous PRN Hugelmeyer, Alexis, DO      . vitamin E capsule 400-800 Units  400-800 Units Oral Daily Hugelmeyer, Alexis, DO   400 Units at 06/05/16 1000  . zolpidem (AMBIEN) tablet 5 mg  5 mg Oral QHS PRN,MR X 1 Hugelmeyer, Alexis, DO            Vital Signs: Blood pressure (!) 141/82, pulse (!) 104,  temperature 99 F (37.2 C), resp. rate 16, height '5\' 11"'$  (1.803 m), weight 59.2 kg (130 lb 8.2 oz), SpO2 98 %.   Intake/Output Summary (Last 24 hours) at 06/09/16 1059 Last data filed at 06/09/16 0800  Gross per 24 hour  Intake          2588.09 ml  Output              835 ml  Net          1753.09 ml    Weight trends: Filed Weights   05/27/2016 1654 05/22/2016 0022  Weight: 50.3 kg (111 lb) 59.2 kg (130 lb 8.2 oz)    Physical Exam: General: Critically ill appearing, cachectic  HEENT Sunken eyes, temporal wasting, ETT in place  Neck: supple  Lungs: Coarse b/l, vent assisted  Heart:: S1S2 no ru bs  Abdomen: Soft, distended, umblicus everted  Extremities: + dependent edema, cool to touch  Skin: Scattered ecchymoses  GU: Foley in place             Lab results: Basic Metabolic Panel:  Recent Labs Lab 06/07/16 2222 06/08/16 0613 06/09/16 0547  NA 141 141 139  K 4.4 4.3 3.6  CL 112* 109 106  CO2 20* 23 23  GLUCOSE 109* 146* 183*  BUN 59* 60* 66*  CREATININE 2.56* 2.66* 2.86*  CALCIUM 8.7* 8.2* 7.9*  MG 2.0 1.9 1.7  PHOS 5.3*  --  4.3    Liver Function Tests:  Recent Labs Lab 06/09/16 0547  AST 43*  ALT 15*  ALKPHOS 41  BILITOT 1.8*  PROT 4.9*  ALBUMIN 3.4*    Recent Labs Lab 05/07/2016 1706  LIPASE 34    Recent Labs Lab 06/09/16 0558  AMMONIA 46*    CBC:  Recent Labs Lab 05/10/2016 1706  06/08/16 2202 06/09/16 0547  WBC 6.8  < > 19.2* 23.5*  NEUTROABS 4.5  --  16.9*  --   HGB 6.5*  < > 8.0* 6.7*  HCT 18.8*  < > 23.8* 20.0*  MCV 105.0*  < > 92.7 93.8  PLT 163  < > 89* 134*  < > = values in this interval not displayed.  Cardiac Enzymes:  Recent Labs Lab 06/05/2016 1342  TROPONINI <0.03    BNP: Invalid input(s): POCBNP  CBG:  Recent Labs Lab 06/08/16 1554 06/08/16 1945 06/08/16 2327 06/09/16 0415 06/09/16 0702  GLUCAP 152* 149* 135* 148* 154*  Microbiology: Recent Results (from the past 720 hour(s))  MRSA PCR Screening      Status: None   Collection Time: 06/05/2016 12:10 AM  Result Value Ref Range Status   MRSA by PCR NEGATIVE NEGATIVE Final    Comment:        The GeneXpert MRSA Assay (FDA approved for NASAL specimens only), is one component of a comprehensive MRSA colonization surveillance program. It is not intended to diagnose MRSA infection nor to guide or monitor treatment for MRSA infections.   Body fluid culture     Status: None (Preliminary result)   Collection Time: 06/07/16 10:08 AM  Result Value Ref Range Status   Specimen Description PLEURAL  Final   Special Requests NONE  Final   Gram Stain   Final    RARE WBC PRESENT, PREDOMINANTLY MONONUCLEAR NO ORGANISMS SEEN    Culture   Final    NO GROWTH < 24 HOURS Performed at [facility], [place] [address], [place], [place] [address]    Report Status PENDING  Incomplete     Coagulation Studies:  Recent Labs  06/07/16 1050 06/07/16 2224 06/09/16 0547  LABPROT 18.9* 20.6* 26.9*  INR 1.57 1.74 2.43    Urinalysis:  Recent Labs  06/06/16 1649  COLORURINE YELLOW*  LABSPEC 1.013  PHURINE 5.0  GLUCOSEU NEGATIVE  HGBUR NEGATIVE  BILIRUBINUR NEGATIVE  KETONESUR 5*  PROTEINUR NEGATIVE  NITRITE NEGATIVE  LEUKOCYTESUR NEGATIVE        Imaging: Dg Chest 1 View  Result Date: 06/08/2016 CLINICAL DATA:  Shortness of breath.  Status post thoracentesis. EXAM: CHEST 1 VIEW COMPARISON:  06/07/2016 and prior exams FINDINGS: Cardiomediastinal silhouette is unchanged. An endotracheal tube with tip 4.5 cm above carina, left IJ central venous catheter with tip overlying the SVC, and NG tube entering the stomach with tip off the field of view again noted. Diffuse bilateral airspace opacities/edema, right greater than left, again noted. Bilateral pleural effusions, right greater than left are unchanged. An equivocal small left apical pneumothorax is noted but decreased in size from prior study. No other changes noted. IMPRESSION: Equivocal  small left apical pneumothorax which is decreased in size from the prior study. No other significant changes. Electronically Signed   By: Margarette Canada M.D.   On: 06/08/2016 07:07   Dg Chest Port 1 View  Result Date: 06/07/2016 CLINICAL DATA:  Central line placement.  Initial encounter. EXAM: PORTABLE CHEST 1 VIEW COMPARISON:  Chest radiograph performed earlier today at 10:40 a.m. FINDINGS: The patient's endotracheal tube is seen ending 6 cm above the carina. An enteric tube is seen extending below the diaphragm. The patient's left IJ line is noted ending about the mid SVC. Improving diffuse right-sided airspace opacification is noted, with an underlying small right pleural effusion. Underlying vascular congestion is noted, with hazy central opacity, raising concern for interstitial edema. A small left pleural effusion is also noted. No pneumothorax is seen. The cardiomediastinal silhouette is normal in size. No acute osseous abnormalities are seen. A linear lucency overlying the left hemithorax appears to reflect a skin fold. IMPRESSION: 1. Left IJ line noted ending about the mid SVC. 2. Endotracheal tube seen ending 6 cm above the carina. 3. Improving diffuse right-sided airspace opacification, with underlying small right pleural effusion, concerning for pneumonia. 4. Underlying vascular congestion, with hazy central opacities, concerning for superimposed interstitial edema. Small left pleural effusion noted. Electronically Signed   By: Garald Balding M.D.   On: 06/07/2016 22:43  Assessment & Plan: Pt is a 56 y.o. caucasian  male with a PMHX of significant alcohol abuse until 2015, Hep C related cirrhosis, h/o treatment with interferon (failed),DM-2 , was admitted on 05/24/2016 with upper GI bleed  1. ARF on chronic renal failure st 3 - S Creatinine has been steadily worsening over the past 2 months - Baseline 1.28 in April - Admit Cr 2.18 Differential includes hepatorenal syndrome, ATN, hypovolemia,  interstitial nephritis - urine P/C ratio is 0.49  -  For now continue supportive care. The family has opted against hemodialysis which is reasonable given the severity of illness.  2. Acute resp failure - Patient remains on the ventilator at the moment however family is awaiting additional family to arrive and are actively considering comfort care.  3. 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

## 2016-06-09 NOTE — Progress Notes (Signed)
Patient is extubate and placed on 4 lpm O2 [place]

## 2016-06-09 NOTE — Progress Notes (Signed)
Pt is on Morphine Drip and transitioned to comfort care. Awaiting transfer to oncology floor. Report given to Hiral, Charity fundraiserN. Full assessment in EPIC.

## 2016-06-09 NOTE — Progress Notes (Signed)
Pt transitioned to comfort care per family request and MD orders. 10 am medications and noon medications held at family request in anticipation of this transition. Dr.Simmonds notified of transition.

## 2016-06-09 NOTE — Progress Notes (Signed)
eLink Physician-Brief Progress Note Patient Name: Benjamin ClasHarold D Gude DOB: 1960-10-28 MRN: [id number]   Date of Service  06/09/2016  HPI/Events of Note  Patient is comfort measures. Nursing requests transfer to Palliative Care bed and conversion of Fentanyl IV infusion to a Morphine IV infusion to make an ICU bed for another critically ill patient.  eICU Interventions  Will order: 1. Transfer to a Palliative Care bed.  2. D/C Fentanyl IV and IV infusion.  3. Morphine IV infusion and bolus from infusion.      Intervention Category Major Interventions: Other:  [name],[name] 06/09/2016, 6:51 PM

## 2016-06-09 NOTE — Progress Notes (Signed)
Medical records and hospital course reviewed. I spoke with pt's wife in detail and she related a long and complicated history dating back almost 2 decades and progressive decline, now requiring significant assistance even on his best days. We discussed goals of care further and she wishes to proceed with terminal extubation and comfort measures only. "withdrawal" protocol orders have been placed  Billy Fischeravid Simonds, MD PCCM service Mobile [phone] Pager [phone] 06/09/2016 2:17 PM

## 2016-06-09 NOTE — Care Management (Signed)
This RNCM also received notification from [facility] that [facility] are following for home health RN, PT, OT, and NA. Per progression rounding this AM patient is bleeding again and MD plans to meet with family about patient's status.

## 2016-06-09 NOTE — Care Management (Signed)
[facility] comfort care.

## 2016-06-09 NOTE — Progress Notes (Signed)
[facility] [place] at [facility]   PATIENT NAME: Benjamin Bradley    MR#:  [id number]  DATE OF BIRTH:  1960/11/22  SUBJECTIVE:   Plan for terminal extubation today as per Intensivist notes and discussing with nursing staff.    REVIEW OF SYSTEMS:    Review of Systems  Unable to perform ROS: Mental acuity    DRUG ALLERGIES:   Allergies  Allergen Reactions  . Nsaids Other (See Comments)    Cause severe hepatic reaction due to liver disease  . Vioxx [Rofecoxib] Other (See Comments)    Causes hepatic reaction due to liver disease  . Codeine Nausea And Vomiting    VITALS:  Blood pressure 129/78, pulse 91, temperature 99 F (37.2 C), resp. rate 16, height 5\' 11"  (1.803 m), weight 59.2 kg (130 lb 8.2 oz), SpO2 97 %.  PHYSICAL EXAMINATION:   Physical Exam   LABORATORY PANEL:   CBC  Recent Labs Lab 06/09/16 0547  WBC 23.5*  HGB 6.7*  HCT 20.0*  PLT 134*   ------------------------------------------------------------------------------------------------------------------  Chemistries   Recent Labs Lab 06/09/16 0547  NA 139  K 3.6  CL 106  CO2 23  GLUCOSE 183*  BUN 66*  CREATININE 2.86*  CALCIUM 7.9*  MG 1.7  AST 43*  ALT 15*  ALKPHOS 41  BILITOT 1.8*   ------------------------------------------------------------------------------------------------------------------  Cardiac Enzymes  Recent Labs Lab 03-07-2016 1342  TROPONINI <0.03   ------------------------------------------------------------------------------------------------------------------  RADIOLOGY:  Dg Chest 1 View  Result Date: 06/08/2016 CLINICAL DATA:  Shortness of breath.  Status post thoracentesis. EXAM: CHEST 1 VIEW COMPARISON:  06/07/2016 and prior exams FINDINGS: Cardiomediastinal silhouette is unchanged. An endotracheal tube with tip 4.5 cm above carina, left IJ central venous catheter with tip overlying the SVC, and NG tube entering the stomach with tip off the  field of view again noted. Diffuse bilateral airspace opacities/edema, right greater than left, again noted. Bilateral pleural effusions, right greater than left are unchanged. An equivocal small left apical pneumothorax is noted but decreased in size from prior study. No other changes noted. IMPRESSION: Equivocal small left apical pneumothorax which is decreased in size from the prior study. No other significant changes. Electronically Signed   By: Harmon PierJeffrey  Hu M.D.   On: 06/08/2016 07:07   Dg Chest Port 1 View  Result Date: 06/07/2016 CLINICAL DATA:  Central line placement.  Initial encounter. EXAM: PORTABLE CHEST 1 VIEW COMPARISON:  Chest radiograph performed earlier today at 10:40 a.m. FINDINGS: The patient's endotracheal tube is seen ending 6 cm above the carina. An enteric tube is seen extending below the diaphragm. The patient's left IJ line is noted ending about the mid SVC. Improving diffuse right-sided airspace opacification is noted, with an underlying small right pleural effusion. Underlying vascular congestion is noted, with hazy central opacity, raising concern for interstitial edema. A small left pleural effusion is also noted. No pneumothorax is seen. The cardiomediastinal silhouette is normal in size. No acute osseous abnormalities are seen. A linear lucency overlying the left hemithorax appears to reflect a skin fold. IMPRESSION: 1. Left IJ line noted ending about the mid SVC. 2. Endotracheal tube seen ending 6 cm above the carina. 3. Improving diffuse right-sided airspace opacification, with underlying small right pleural effusion, concerning for pneumonia. 4. Underlying vascular congestion, with hazy central opacities, concerning for superimposed interstitial edema. Small left pleural effusion noted. Electronically Signed   By: Roanna RaiderJeffery  Chang M.D.   On: 06/07/2016 22:43     ASSESSMENT AND PLAN:  56 year old male with past medical history of alcohol abuse, alcoholic liver disease with  cirrhosis, chronic anemia, diabetes, hepatitis C, depression and was admitted to the hospital due to an upper GI bleed  1. GI bleed. 2. Acute respiratory failure with hypoxia 3. Acute blood loss anemia 4. Altered mental status 5. Acute kidney injury  6. Hypotension  7. Chronic Liver Disease/Cirrhosis - due to ETOH abuse, Hep. C.   Overall prognosis Is very poor given multiple comorbidities and chronic liver disease. Intensivist had long discussion with Pt's wife about poor prognosis and they have agreed to terminal extubation and COMFORT CARE ONLY.    All the records are reviewed and case discussed with Care Management/Social Worker. Management plans discussed with the patient, family and they are in agreement.  CODE STATUS: DNR  DVT Prophylaxis: Teds and SCDs   TOTAL TIME TAKING CARE OF THIS PATIENT: 15 minutes.   POSSIBLE D/C unclear , DEPENDING ON CLINICAL CONDITION.   Houston Siren M.D on 06/09/2016 at 3:40 PM  Between 7am to 6pm - Pager - [phone]  After 6pm go to [email or web address] - Social research officer, government  [facility] [place] [facility]  Office  [phone]  CC: Primary care physician; Patient, No Pcp Per

## 2016-06-09 NOTE — Progress Notes (Signed)
Patient requiring an increase in levophed throughout morning max dose 40 mcg/min obtained Tukov NP notified verbal order taken to increase dose to 65 mcg/min. Wife at bedside aware of patient's blood pressure decreasing and the increase need for medication to sustain pressure. This nurse previously confirmed with wife that POC was to not escalate care beyond current medications at start of the shift.

## 2016-06-09 NOTE — Care Management (Signed)
Received text from [facility].  Patient currently open to that aagency

## 2016-06-09 NOTE — Progress Notes (Signed)
Chaplain received an order to visit with patient in ICU 18.  Order was for end of life. Patient and family declined to see Chaplain.    06/09/16 1350  Clinical Encounter Type  Visited With Patient and family together  Visit Type Initial;Spiritual support  Referral From Nurse  Consult/Referral To Chaplain  Spiritual Encounters  Spiritual Needs Grief support

## 2016-06-09 NOTE — Progress Notes (Signed)
CDS called per protocol. Spoke with April.

## 2016-06-09 NOTE — Plan of Care (Signed)
Problem: Education: Goal: Knowledge of [facility] Education information/materials will improve Outcome: Progressing Pt and family were provided with welcome information, oriented to the unit, had questions answered and were introduced to the staff.   Problem: Safety: Goal: Ability to remain free from injury will improve Outcome: Progressing Pt has remained free from injury on my shift.   Problem: Pain Managment: Goal: General experience of comfort will improve Outcome: Progressing Pt's pain controlled with Fentanyl Drip. Pt pain score 0 as evidenced by the CPOT scale.

## 2016-06-10 LAB — BODY FLUID CULTURE: Culture: NO GROWTH

## 2016-06-10 LAB — MISC LABCORP TEST (SEND OUT): LABCORP TEST CODE: 19588

## 2016-07-06 NOTE — Progress Notes (Signed)
Patient transferred from CCU via hospital bed to room 114 earlier tonight around 2115. Currently family at the bedside while patient is resting peacefully. Currently respirations are at 6 RR/min. Patient tolerating morphine drip. Will continue to monitor patient to end of shift.

## 2016-07-06 NOTE — Progress Notes (Addendum)
Nurse summoned to patient's room and noted patient without and apical pulse and without respirations and eyes are fixed. Second nurse Engineer, manufacturing systems(Charge Nurse) verified no pulse or respirations as well. Time of passing at 0132. Patient is DNR. Family at the bedside. On-call doctor and supervisor notified. Currently no order given to be released to a funeral home for the wife states she will call to provide that information. Patient will be transferred to the morgue until further notice given.

## 2016-07-06 NOTE — Death Summary Note (Signed)
DEATH SUMMARY   Patient Details  Name: Benjamin Bradley MRN: [id number] DOB: 1960-02-26  Admission/Discharge Information   Admit Date:  05/24/2016  Date of Death: Date of Death: 06/22/2016  Time of Death: Time of Death: 0132  Length of Stay: 7  Referring Physician: Patient, No Pcp Per   Reason(s) for Hospitalization  Upper GI bleed, Esophogeal Varcies  Diagnoses  Preliminary cause of death:  Secondary Diagnoses (including complications and co-morbidities):  Active Problems:   Upper GI bleed   Acute GI bleeding   Cirrhosis of liver without ascites (HCC)   Coagulopathy (HCC)   Pleural effusion associated with hepatic disorder   Palliative care by specialist   Goals of care, counseling/discussion   Acute renal failure (HCC)   S/P thoracentesis   Brief Hospital Course (including significant findings, care, treatment, and services provided and events leading to death)  Benjamin ClasHarold D Guo is a 56 y.o. year old male who past medical history of alcohol abuse, alcoholic liver disease with cirrhosis, chronic anemia, diabetes, hepatitis C, depression and was admitted to the hospital due to an upper GI bleed  1. GI bleed - this was the cause of patient's worsening anemia. Patient had a upper GI bleed secondary to esophageal varices. -Seen by gastroenterology and status post banding of the esophageal varices.  - s/p multiple blood transfusion and Hg. Had improved.   2. Acute respiratory failure with hypoxia- secondary to pleural effusions and anasarca.  -Remains intubated on 60% FiO2. Status post right-sided thoracentesis yesterday with 2 L of fluid removed  3. Acute blood loss anemia - due to # 1. S/p multiple units of PRBC's and hg. Improved post transfusion.   4. Acute kidney injury - patient has acute on chronic renal failure. -As per nephrology baseline creatinine around 1.2. Was up to 2.4. Differential is hepatorenal (vs) ATN (vs) hypovolemic  (vs) interstitial nephritis. -Poor  candidate for dialysis. Prognosis poor.   5. Hypotension - due to chronic liver disease.  Was treated with IV abx for underlying sepsis and also fluids, and vasopressors.   6. Chronic Liver Disease/Cirrhosis - due to ETOH abuse, Hep. C.   Overall prognosis Is very poor given multiple comorbidities and chronic liver disease. Patient was being maintained on aggressive care and treatment as mentioned above but despite aggressive care patient's clinical course was not improving. Palliative care consult was obtained. Intensivist had a long discussion with the patient's wife and family and agreed to making the patient comfort care only. Patient was terminally extubated on the afternoon off 06/09/2016 and passed away on that day.    Pertinent Labs and Studies  Significant Diagnostic Studies Dg Chest 1 View  Result Date: 06/08/2016 CLINICAL DATA:  Shortness of breath.  Status post thoracentesis. EXAM: CHEST 1 VIEW COMPARISON:  06/07/2016 and prior exams FINDINGS: Cardiomediastinal silhouette is unchanged. An endotracheal tube with tip 4.5 cm above carina, left IJ central venous catheter with tip overlying the SVC, and NG tube entering the stomach with tip off the field of view again noted. Diffuse bilateral airspace opacities/edema, right greater than left, again noted. Bilateral pleural effusions, right greater than left are unchanged. An equivocal small left apical pneumothorax is noted but decreased in size from prior study. No other changes noted. IMPRESSION: Equivocal small left apical pneumothorax which is decreased in size from the prior study. No other significant changes. Electronically Signed   By: Harmon PierJeffrey  Hu M.D.   On: 06/08/2016 07:07   Dg Chest 1 View  Addendum  Date: 06/07/2016   ADDENDUM REPORT: 06/07/2016 17:16 ADDENDUM: Correction on the clinical data. This chest x-ray performed following left thoracentesis. Decreasing left pleural effusion noted as described on prior report. No  pneumothorax. Electronically Signed   By: Charlett Nose M.D.   On: 06/07/2016 17:16   Result Date: 06/07/2016 CLINICAL DATA:  Status post right chest thoracentesis today. EXAM: CHEST 1 VIEW COMPARISON:  Chest x-rays from earlier same day and chest x-ray dated 06/14/2016. FINDINGS: There is improved aeration of the left lung compared to the most recent chest x-ray from earlier today, previously moderate to large left pleural effusion. Veiled opacity at the mid and lower zones of the left lung suggests some persistent layering pleural effusion. There is significantly increasing opacification of the right hemithorax, presumably pleural effusion and/or airspace collapse. Endotracheal tube appears stable in position with tip approximately 3.5 cm above the carina. Enteric tube passes below the diaphragm. Heart size and mediastinal contours are grossly stable, partially obscured by the airspace opacities. IMPRESSION: 1. SIGNIFICANTLY INCREASED OPACIFICATION of the RIGHT hemithorax, presumably large pleural effusion and/or airspace collapse. 2. Significantly improved aeration of the left lung, suspect small amount of persistent layering pleural effusion. 3. Support apparatus appears appropriately positioned. These results will be called to the ordering clinician or representative by the Radiologist Assistant, and communication documented in the PACS or zVision Dashboard. Electronically Signed: By: Bary Richard M.D. On: 06/07/2016 11:00   Dg Chest 1 View  Result Date: 06-14-16 CLINICAL DATA:  Acute onset of hemoptysis. Dark bloody stools. Acute onset of worsening generalized weakness and shortness of breath with exertion. Initial encounter. EXAM: CHEST 1 VIEW COMPARISON:  None. FINDINGS: A relatively large left-sided pleural effusion is noted. A small right pleural effusion is seen. Underlying airspace opacity may reflect pulmonary edema or pneumonia. Underlying mass cannot be excluded. No pneumothorax is seen. The  cardiomediastinal silhouette is mildly enlarged. Overlying postoperative change is noted at the right side of the mediastinum. No acute osseous abnormalities are seen. Cervical spinal fusion hardware is noted. IMPRESSION: 1. Relatively large left-sided pleural effusion. Small right pleural effusion. Underlying airspace opacity may reflect pulmonary edema or pneumonia. Underlying mass cannot be excluded. 2. Mild cardiomegaly. Electronically Signed   By: Roanna Raider M.D.   On: 06/14/2016 21:55   Dg Abd 1 View  Result Date: 06/07/2016 CLINICAL DATA:  Orogastric tube placement.  Initial encounter. EXAM: ABDOMEN - 1 VIEW COMPARISON:  CT of the abdomen and pelvis performed 02/07/2004 FINDINGS: The patient's enteric tube is noted ending overlying the body of the stomach. The stomach is largely filled with air. The visualized bowel gas pattern is grossly unremarkable. No free intra-abdominal air is seen, though evaluation for free air is limited on a single supine view. No acute osseous abnormalities are seen. Small right and moderate to large left-sided pleural effusions are seen. IMPRESSION: 1. Enteric tube noted ending overlying the body of the stomach. 2. Small right and moderate to large left-sided pleural effusions noted. Electronically Signed   By: Roanna Raider M.D.   On: 06/07/2016 06:43   US Renal  Result Date: 06/06/2016 CLINICAL DATA:  Renal failure EXAM: RENAL / URINARY TRACT ULTRASOUND COMPLETE COMPARISON:  CT abdomen and pelvis February 07, 2004 FINDINGS: Right Kidney: Length: 10.7 cm. Echogenicity is diffusely increased. The renal cortical thickness is normal. No mass, perinephric fluid, or hydronephrosis visualized. No sonographically demonstrable calculus or ureterectasis. Left Kidney: Length: 9.8 cm. Echogenicity is diffusely increased. Renal cortical thickness is normal. No  mass, perinephric fluid, or hydronephrosis visualized. There is no sonographically demonstrable calculus or ureterectasis.  Bladder: Appears normal for degree of bladder distention. There is extensive ascites. IMPRESSION: Kidneys are echogenic bilaterally, a finding indicative of medical renal disease. No obstructing focus in either kidney. Renal cortical thickness bilaterally. Kidneys otherwise unremarkable in appearance. There is extensive ascites. Electronically Signed   By: Bretta Bang III M.D.   On: 06/06/2016 15:54   Dg Chest Port 1 View  Result Date: 06/07/2016 CLINICAL DATA:  Central line placement.  Initial encounter. EXAM: PORTABLE CHEST 1 VIEW COMPARISON:  Chest radiograph performed earlier today at 10:40 a.m. FINDINGS: The patient's endotracheal tube is seen ending 6 cm above the carina. An enteric tube is seen extending below the diaphragm. The patient's left IJ line is noted ending about the mid SVC. Improving diffuse right-sided airspace opacification is noted, with an underlying small right pleural effusion. Underlying vascular congestion is noted, with hazy central opacity, raising concern for interstitial edema. A small left pleural effusion is also noted. No pneumothorax is seen. The cardiomediastinal silhouette is normal in size. No acute osseous abnormalities are seen. A linear lucency overlying the left hemithorax appears to reflect a skin fold. IMPRESSION: 1. Left IJ line noted ending about the mid SVC. 2. Endotracheal tube seen ending 6 cm above the carina. 3. Improving diffuse right-sided airspace opacification, with underlying small right pleural effusion, concerning for pneumonia. 4. Underlying vascular congestion, with hazy central opacities, concerning for superimposed interstitial edema. Small left pleural effusion noted. Electronically Signed   By: Roanna Raider M.D.   On: 06/07/2016 22:43   Dg Chest Port 1 View  Result Date: 06/07/2016 CLINICAL DATA:  Endotracheal tube placement.  Initial encounter. EXAM: PORTABLE CHEST 1 VIEW COMPARISON:  Chest radiograph performed earlier today at 12:37 a.m.  FINDINGS: The patient's endotracheal tube is seen ending 4-5 cm above the carina. An enteric tube is seen extending below the diaphragm. Small right and moderate to large left pleural effusions are noted, improved on the left from the prior study. Underlying airspace opacification may reflect pulmonary edema or possibly pneumonia. The cardiomediastinal silhouette is normal in size. No acute osseous abnormalities are identified. IMPRESSION: 1. Endotracheal tube seen ending 4-5 cm above the carina. 2. Small right and moderate to large left pleural effusions, improved on the left from the recent prior study, though this may simply reflect improved lung expansion. Underlying airspace opacification may reflect pulmonary edema or possibly pneumonia. Electronically Signed   By: Roanna Raider M.D.   On: 06/07/2016 06:41   Dg Chest Port 1 View  Result Date: 06/07/2016 CLINICAL DATA:  Acute onset of cough.  Initial encounter. EXAM: PORTABLE CHEST 1 VIEW COMPARISON:  Chest radiograph performed 05/13/2016 FINDINGS: There is complete opacification of the left hemithorax, reflecting an enlarging left-sided pleural effusion. Worsening right-sided airspace opacification raises concern for pneumonia. A small right pleural effusion is again noted. Vascular congestion is noted, and superimposed pulmonary edema is a concern. The cardiomediastinal silhouette is not well assessed due to opacification of the left hemithorax. No acute osseous abnormalities are seen. Cervical spinal fusion hardware is noted. IMPRESSION: 1. Complete opacification of the left hemithorax, reflecting an enlarging left-sided pleural effusion. Diagnostic thoracentesis could be considered for further evaluation. 2. Small right pleural effusion. Worsening right-sided airspace opacification raises concern for pneumonia. 3. Vascular congestion noted. Superimposed pulmonary edema is a concern. Electronically Signed   By: Roanna Raider M.D.   On: 06/07/2016 00:49  Microbiology Recent Results (from the past 240 hour(s))  MRSA PCR Screening     Status: None   Collection Time: 05/24/2016 12:10 AM  Result Value Ref Range Status   MRSA by PCR NEGATIVE NEGATIVE Final    Comment:        The GeneXpert MRSA Assay (FDA approved for NASAL specimens only), is one component of a comprehensive MRSA colonization surveillance program. It is not intended to diagnose MRSA infection nor to guide or monitor treatment for MRSA infections.   Body fluid culture     Status: None   Collection Time: 06/07/16 10:08 AM  Result Value Ref Range Status   Specimen Description PLEURAL  Final   Special Requests NONE  Final   Gram Stain   Final    RARE WBC PRESENT, PREDOMINANTLY MONONUCLEAR NO ORGANISMS SEEN    Culture   Final    NO GROWTH 3 DAYS Performed at [facility], [address], [place], [place] [address]    Report Status 06/17/2016 FINAL  Final    Lab Basic Metabolic Panel:  Recent Labs Lab 06/06/16 0452 06/07/16 0356 06/07/16 2222 06/08/16 0613 06/09/16 0547  NA 139 142 141 141 139  K 4.8 5.1 4.4 4.3 3.6  CL 111 113* 112* 109 106  CO2 18* 17* 20* 23 23  GLUCOSE 91 170* 109* 146* 183*  BUN 58* 57* 59* 60* 66*  CREATININE 2.40* 2.44* 2.56* 2.66* 2.86*  CALCIUM 8.7* 9.2 8.7* 8.2* 7.9*  MG  --   --  2.0 1.9 1.7  PHOS  --   --  5.3*  --  4.3   Liver Function Tests:  Recent Labs Lab 06-15-16 1706 06/05/16 0313 06/09/16 0547  AST 30 31 43*  ALT 14* 16* 15*  ALKPHOS 49 46 41  BILITOT 1.5* 1.6* 1.8*  PROT 5.5* 5.6* 4.9*  ALBUMIN 3.2* 3.3* 3.4*    Recent Labs Lab 15-Jun-2016 1706  LIPASE 34    Recent Labs Lab June 15, 2016 1706 05/19/2016 1421 06/09/16 0558  AMMONIA <9* 24 46*   CBC:  Recent Labs Lab 06/15/16 1706  06/06/16 0452  06/07/16 1633 06/07/16 2222 06/08/16 0613 06/08/16 2202 06/09/16 0547  WBC 6.8  < > 10.5  --   --  11.8* 17.9* 19.2* 23.5*  NEUTROABS 4.5  --   --   --   --   --   --  16.9*  --   HGB 6.5*   < > 9.1*  < > 5.6* 7.7* 8.7* 8.0* 6.7*  HCT 18.8*  < > 26.4*  --   --  23.2* 25.3* 23.8* 20.0*  MCV 105.0*  < > 93.2  --   --  96.4 92.3 92.7 93.8  PLT 163  < > 148*  --   --  175 122* 89* 134*  < > = values in this interval not displayed. Cardiac Enzymes:  Recent Labs Lab 05/15/2016 0005 05/19/2016 0132 05/24/2016 1342  TROPONINI <0.03 <0.03 <0.03   Sepsis Labs:  Recent Labs Lab 06/07/16 2222 06/08/16 0613 06/08/16 2202 06/09/16 0547  WBC 11.8* 17.9* 19.2* 23.5*  LATICACIDVEN 1.9 1.8  --   --     Procedures/Operations  Thoracentesis.     Demitra Danley J 07/02/2016, 2:50 PM

## 2016-07-06 DEATH — deceased

## 2019-01-26 IMAGING — DX DG CHEST 1V PORT
1 series · 1 of 1 positions shown · non-contrast
Comparison: Chest radiograph performed earlier today at [DATE] a.m.

CLINICAL DATA: Endotracheal tube placement.  Initial encounter.

EXAM:
PORTABLE CHEST 1 VIEW

[chest ap]
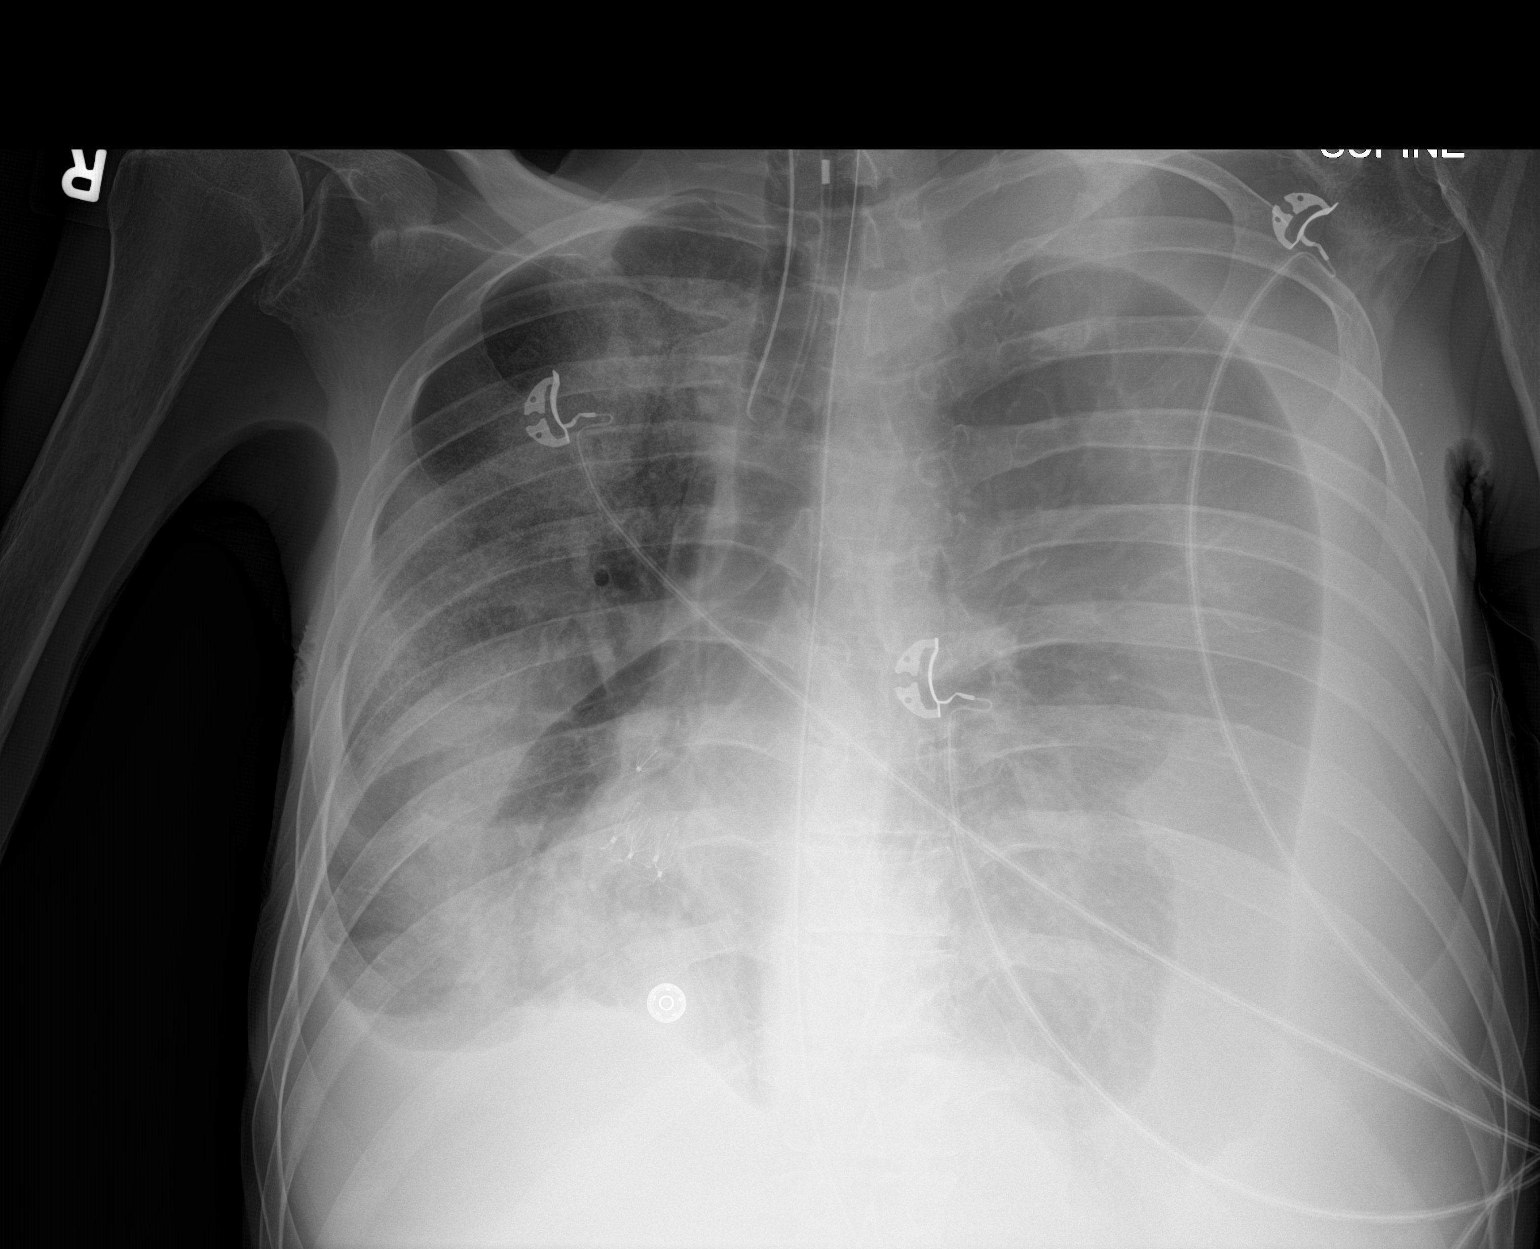

[1 of 1 positions shown; findings below may reference images not displayed]

FINDINGS: The patient's endotracheal tube is seen ending 4-5 cm above the
carina. An enteric tube is seen extending below the diaphragm.

Small right and moderate to large left pleural effusions are noted,
improved on the left from the prior study. Underlying airspace
opacification may reflect pulmonary edema or possibly pneumonia.

The cardiomediastinal silhouette is normal in size. No acute osseous
abnormalities are identified.
IMPRESSION: 1. Endotracheal tube seen ending 4-5 cm above the carina.
2. Small right and moderate to large left pleural effusions,
improved on the left from the recent prior study, though this may
simply reflect improved lung expansion. Underlying airspace
opacification may reflect pulmonary edema or possibly pneumonia.

## 2019-01-26 IMAGING — DX DG CHEST 1V PORT
1 series · 1 of 1 positions shown · non-contrast
Comparison: Chest radiograph performed 06/03/2016

CLINICAL DATA: Acute onset of cough.  Initial encounter.

EXAM:
PORTABLE CHEST 1 VIEW

[chest ap]
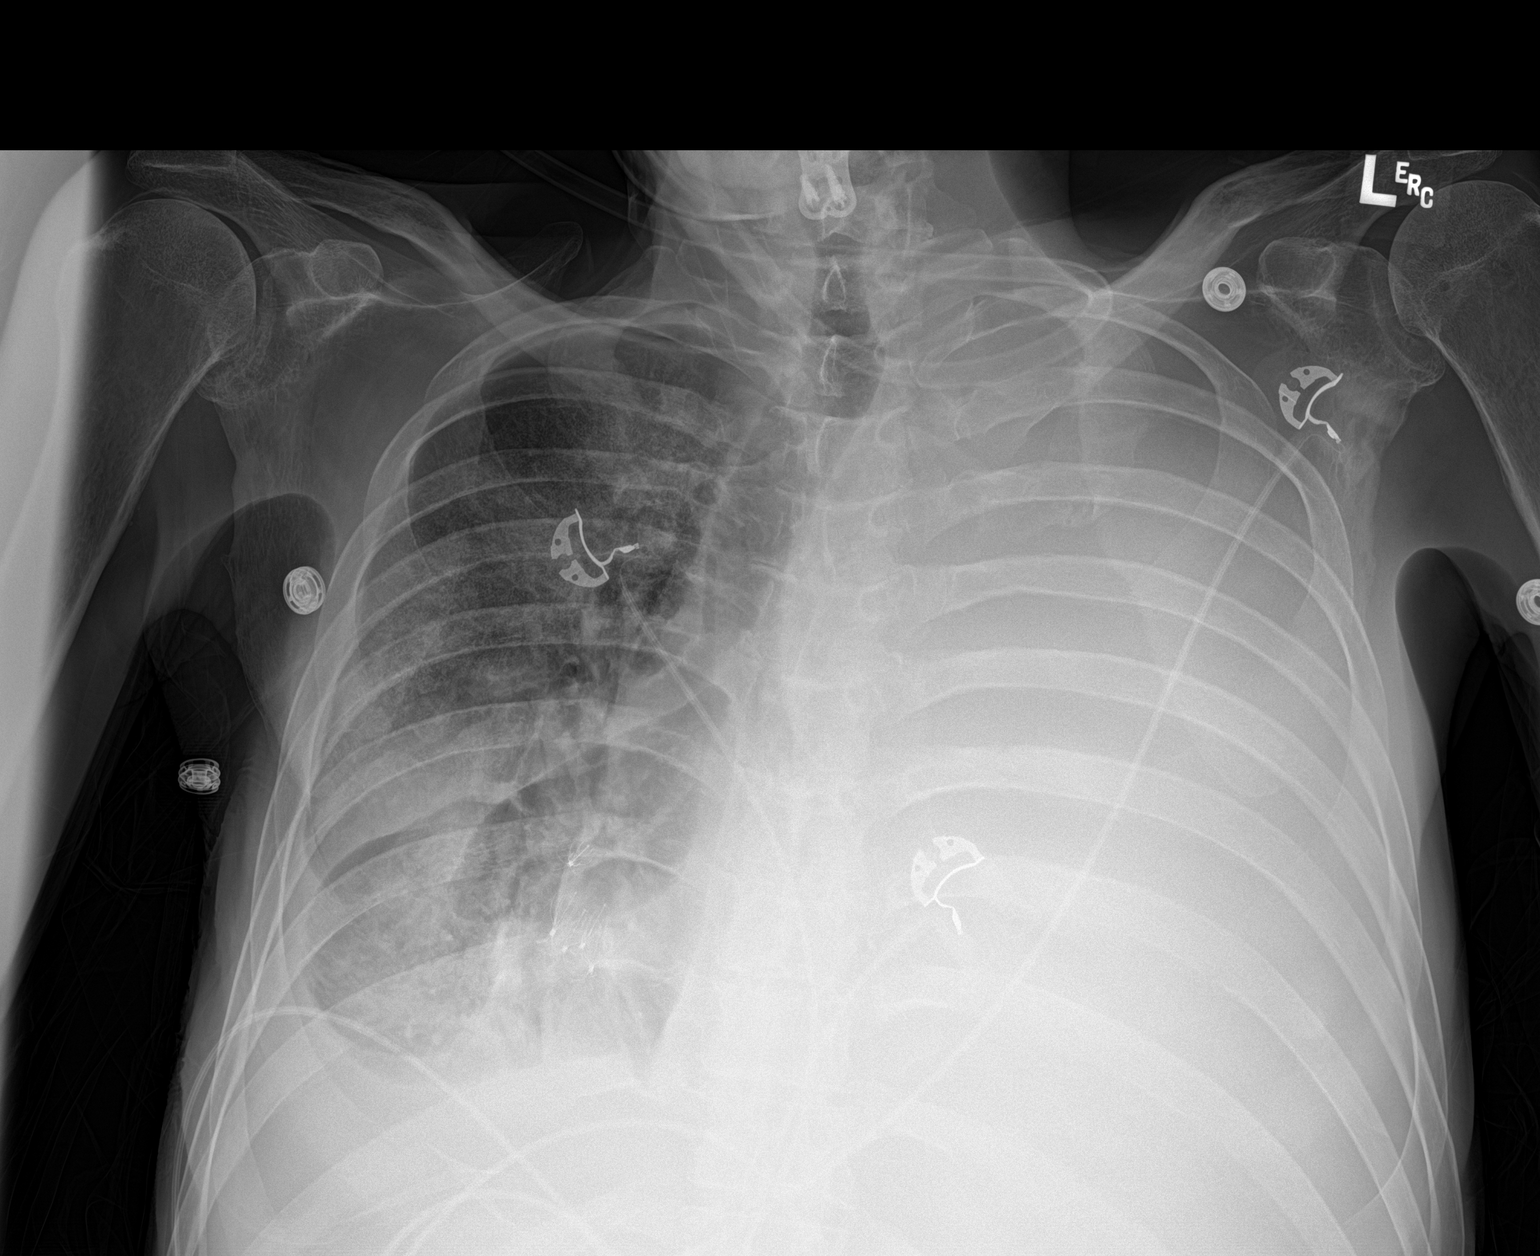

[1 of 1 positions shown; findings below may reference images not displayed]

FINDINGS: There is complete opacification of the left hemithorax, reflecting
an enlarging left-sided pleural effusion. Worsening right-sided
airspace opacification raises concern for pneumonia. A small right
pleural effusion is again noted. Vascular congestion is noted, and
superimposed pulmonary edema is a concern.

The cardiomediastinal silhouette is not well assessed due to
opacification of the left hemithorax. No acute osseous abnormalities
are seen. Cervical spinal fusion hardware is noted.
IMPRESSION: 1. Complete opacification of the left hemithorax, reflecting an
enlarging left-sided pleural effusion. Diagnostic thoracentesis
could be considered for further evaluation.
2. Small right pleural effusion. Worsening right-sided airspace
opacification raises concern for pneumonia.
3. Vascular congestion noted. Superimposed pulmonary edema is a
concern.

## 2019-01-26 IMAGING — DX DG CHEST 1V PORT
1 series · 1 of 1 positions shown · non-contrast
Comparison: Chest radiograph performed earlier today at [DATE] a.m.

CLINICAL DATA: Central line placement.  Initial encounter.

EXAM:
PORTABLE CHEST 1 VIEW

[chest ap]
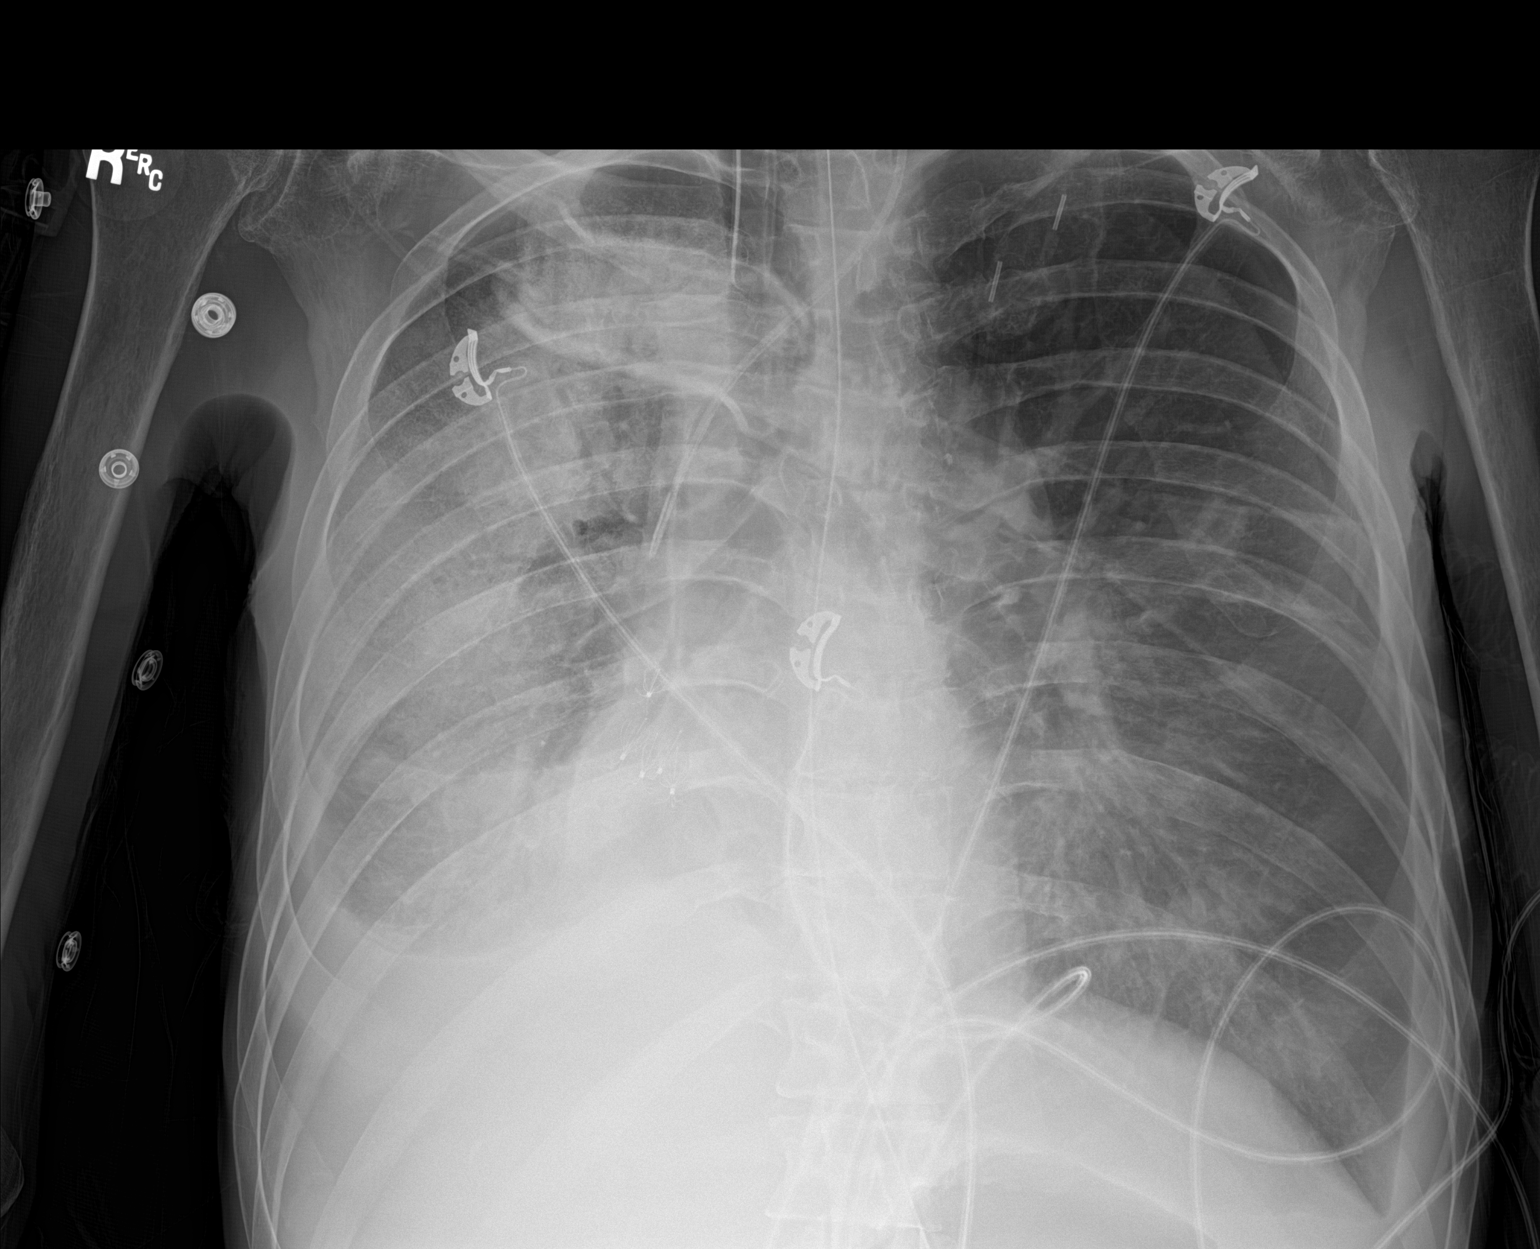

[1 of 1 positions shown; findings below may reference images not displayed]

FINDINGS: The patient's endotracheal tube is seen ending 6 cm above the
carina. An enteric tube is seen extending below the diaphragm.

The patient's left IJ line is noted ending about the mid SVC.

Improving diffuse right-sided airspace opacification is noted, with
an underlying small right pleural effusion. Underlying vascular
congestion is noted, with hazy central opacity, raising concern for
interstitial edema. A small left pleural effusion is also noted. No
pneumothorax is seen.

The cardiomediastinal silhouette is normal in size. No acute osseous
abnormalities are seen. A linear lucency overlying the left
hemithorax appears to reflect a skin fold.
IMPRESSION: 1. Left IJ line noted ending about the mid SVC.
2. Endotracheal tube seen ending 6 cm above the carina.
3. Improving diffuse right-sided airspace opacification, with
underlying small right pleural effusion, concerning for pneumonia.
4. Underlying vascular congestion, with hazy central opacities,
concerning for superimposed interstitial edema. Small left pleural
effusion noted.
